# Patient Record
Sex: Female | Born: 1935 | Race: White | Hispanic: No | Smoking: Never smoker
Health system: Southern US, Community
[De-identification: ages and names within clinical notes are randomized; demographics above are authoritative.]

## PROBLEM LIST (undated history)

## (undated) DIAGNOSIS — F32A Depression, unspecified: Secondary | ICD-10-CM

## (undated) DIAGNOSIS — F329 Major depressive disorder, single episode, unspecified: Secondary | ICD-10-CM

## (undated) DIAGNOSIS — F419 Anxiety disorder, unspecified: Secondary | ICD-10-CM

## (undated) DIAGNOSIS — I10 Essential (primary) hypertension: Secondary | ICD-10-CM

## (undated) DIAGNOSIS — E538 Deficiency of other specified B group vitamins: Secondary | ICD-10-CM

## (undated) DIAGNOSIS — D0000 Carcinoma in situ of oral cavity, unspecified site: Secondary | ICD-10-CM

## (undated) DIAGNOSIS — T7840XA Allergy, unspecified, initial encounter: Secondary | ICD-10-CM

## (undated) DIAGNOSIS — E785 Hyperlipidemia, unspecified: Secondary | ICD-10-CM

## (undated) DIAGNOSIS — I447 Left bundle-branch block, unspecified: Secondary | ICD-10-CM

## (undated) DIAGNOSIS — F0150 Vascular dementia without behavioral disturbance: Secondary | ICD-10-CM

## (undated) HISTORY — DX: Major depressive disorder, single episode, unspecified: F32.9

## (undated) HISTORY — DX: Essential (primary) hypertension: I10

## (undated) HISTORY — DX: Carcinoma in situ of oral cavity, unspecified site: D00.00

## (undated) HISTORY — DX: Hyperlipidemia, unspecified: E78.5

## (undated) HISTORY — PX: ABDOMINAL HYSTERECTOMY: SHX81

## (undated) HISTORY — DX: Allergy, unspecified, initial encounter: T78.40XA

## (undated) HISTORY — PX: TONSILLECTOMY: SUR1361

## (undated) HISTORY — DX: Deficiency of other specified B group vitamins: E53.8

## (undated) HISTORY — DX: Depression, unspecified: F32.A

## (undated) HISTORY — DX: Left bundle-branch block, unspecified: I44.7

## (undated) HISTORY — DX: Vascular dementia, unspecified severity, without behavioral disturbance, psychotic disturbance, mood disturbance, and anxiety: F01.50

## (undated) HISTORY — DX: Anxiety disorder, unspecified: F41.9

---

## 2002-07-03 DIAGNOSIS — F0150 Vascular dementia without behavioral disturbance: Secondary | ICD-10-CM | POA: Insufficient documentation

## 2008-12-28 ENCOUNTER — Encounter: Payer: Self-pay | Admitting: Internal Medicine

## 2009-07-03 HISTORY — PX: CATARACT EXTRACTION: SUR2

## 2009-08-03 ENCOUNTER — Encounter: Payer: Self-pay | Admitting: Internal Medicine

## 2009-08-11 ENCOUNTER — Encounter: Payer: Self-pay | Admitting: Internal Medicine

## 2009-08-16 ENCOUNTER — Ambulatory Visit: Payer: Self-pay | Admitting: Internal Medicine

## 2009-08-16 DIAGNOSIS — M810 Age-related osteoporosis without current pathological fracture: Secondary | ICD-10-CM | POA: Insufficient documentation

## 2009-08-16 DIAGNOSIS — E538 Deficiency of other specified B group vitamins: Secondary | ICD-10-CM

## 2009-08-16 DIAGNOSIS — J309 Allergic rhinitis, unspecified: Secondary | ICD-10-CM | POA: Insufficient documentation

## 2009-08-16 DIAGNOSIS — I10 Essential (primary) hypertension: Secondary | ICD-10-CM | POA: Insufficient documentation

## 2009-08-16 DIAGNOSIS — F411 Generalized anxiety disorder: Secondary | ICD-10-CM | POA: Insufficient documentation

## 2009-08-16 DIAGNOSIS — E785 Hyperlipidemia, unspecified: Secondary | ICD-10-CM | POA: Insufficient documentation

## 2009-08-16 DIAGNOSIS — F329 Major depressive disorder, single episode, unspecified: Secondary | ICD-10-CM | POA: Insufficient documentation

## 2009-08-16 DIAGNOSIS — C069 Malignant neoplasm of mouth, unspecified: Secondary | ICD-10-CM | POA: Insufficient documentation

## 2009-08-19 ENCOUNTER — Ambulatory Visit: Payer: Self-pay | Admitting: Internal Medicine

## 2009-08-23 LAB — CONVERTED CEMR LAB
AST: 18 units/L (ref 0–37)
Albumin: 3.4 g/dL — ABNORMAL LOW (ref 3.5–5.2)
Alkaline Phosphatase: 162 units/L — ABNORMAL HIGH (ref 39–117)
BUN: 16 mg/dL (ref 6–23)
Basophils Absolute: 0 10*3/uL (ref 0.0–0.1)
Bilirubin, Direct: 0.1 mg/dL (ref 0.0–0.3)
CO2: 30 meq/L (ref 19–32)
Calcium: 8.8 mg/dL (ref 8.4–10.5)
Chloride: 106 meq/L (ref 96–112)
Cholesterol: 167 mg/dL (ref 0–200)
Eosinophils Absolute: 0.2 10*3/uL (ref 0.0–0.7)
LDL Cholesterol: 89 mg/dL (ref 0–99)
Lymphocytes Relative: 18.4 % (ref 12.0–46.0)
MCHC: 32.6 g/dL (ref 30.0–36.0)
MCV: 92.9 fL (ref 78.0–100.0)
Monocytes Absolute: 0.4 10*3/uL (ref 0.1–1.0)
Neutro Abs: 4.9 10*3/uL (ref 1.4–7.7)
Neutrophils Relative %: 71.8 % (ref 43.0–77.0)
RDW: 13.1 % (ref 11.5–14.6)
TSH: 1.52 microintl units/mL (ref 0.35–5.50)
Total Protein: 6.5 g/dL (ref 6.0–8.3)
Triglycerides: 84 mg/dL (ref 0.0–149.0)

## 2009-08-25 ENCOUNTER — Ambulatory Visit: Payer: Self-pay | Admitting: Internal Medicine

## 2009-08-27 ENCOUNTER — Ambulatory Visit: Payer: Self-pay | Admitting: Internal Medicine

## 2009-08-31 DIAGNOSIS — I447 Left bundle-branch block, unspecified: Secondary | ICD-10-CM

## 2009-08-31 HISTORY — DX: Left bundle-branch block, unspecified: I44.7

## 2009-08-31 HISTORY — PX: OTHER SURGICAL HISTORY: SHX169

## 2009-09-06 ENCOUNTER — Encounter: Payer: Self-pay | Admitting: Internal Medicine

## 2009-09-08 ENCOUNTER — Ambulatory Visit: Payer: Self-pay | Admitting: Internal Medicine

## 2009-09-13 ENCOUNTER — Encounter: Payer: Self-pay | Admitting: Internal Medicine

## 2009-09-13 ENCOUNTER — Telehealth: Payer: Self-pay | Admitting: Internal Medicine

## 2009-09-15 ENCOUNTER — Encounter: Payer: Self-pay | Admitting: Internal Medicine

## 2009-09-20 ENCOUNTER — Encounter: Payer: Self-pay | Admitting: Internal Medicine

## 2009-09-21 ENCOUNTER — Encounter: Payer: Self-pay | Admitting: Internal Medicine

## 2009-09-22 ENCOUNTER — Encounter: Payer: Self-pay | Admitting: Internal Medicine

## 2009-09-27 ENCOUNTER — Encounter: Payer: Self-pay | Admitting: Internal Medicine

## 2009-10-12 ENCOUNTER — Telehealth (INDEPENDENT_AMBULATORY_CARE_PROVIDER_SITE_OTHER): Payer: Self-pay | Admitting: *Deleted

## 2009-10-14 ENCOUNTER — Encounter: Payer: Self-pay | Admitting: Internal Medicine

## 2009-10-15 ENCOUNTER — Ambulatory Visit: Payer: Self-pay | Admitting: Internal Medicine

## 2009-10-18 ENCOUNTER — Encounter: Payer: Self-pay | Admitting: Internal Medicine

## 2009-10-25 ENCOUNTER — Encounter: Payer: Self-pay | Admitting: Internal Medicine

## 2009-10-26 ENCOUNTER — Encounter: Payer: Self-pay | Admitting: Internal Medicine

## 2009-11-01 ENCOUNTER — Encounter: Payer: Self-pay | Admitting: Internal Medicine

## 2009-11-04 ENCOUNTER — Encounter: Payer: Self-pay | Admitting: Family Medicine

## 2009-11-08 ENCOUNTER — Ambulatory Visit: Payer: Self-pay | Admitting: Internal Medicine

## 2009-11-08 ENCOUNTER — Encounter: Payer: Self-pay | Admitting: Internal Medicine

## 2009-11-15 ENCOUNTER — Encounter: Payer: Self-pay | Admitting: Internal Medicine

## 2009-11-18 ENCOUNTER — Encounter: Payer: Self-pay | Admitting: Internal Medicine

## 2009-12-06 ENCOUNTER — Encounter: Payer: Self-pay | Admitting: Internal Medicine

## 2010-01-04 ENCOUNTER — Ambulatory Visit: Payer: Self-pay | Admitting: Ophthalmology

## 2010-01-10 ENCOUNTER — Ambulatory Visit: Payer: Self-pay | Admitting: Internal Medicine

## 2010-02-14 ENCOUNTER — Telehealth: Payer: Self-pay | Admitting: Internal Medicine

## 2010-02-15 ENCOUNTER — Encounter: Payer: Self-pay | Admitting: Internal Medicine

## 2010-02-21 ENCOUNTER — Encounter: Payer: Self-pay | Admitting: Internal Medicine

## 2010-02-22 ENCOUNTER — Ambulatory Visit: Payer: Self-pay | Admitting: Ophthalmology

## 2010-03-03 ENCOUNTER — Encounter: Payer: Self-pay | Admitting: Internal Medicine

## 2010-03-03 ENCOUNTER — Ambulatory Visit: Payer: Self-pay | Admitting: Internal Medicine

## 2010-05-16 ENCOUNTER — Ambulatory Visit: Payer: Self-pay | Admitting: Internal Medicine

## 2010-05-18 ENCOUNTER — Encounter: Payer: Self-pay | Admitting: Internal Medicine

## 2010-06-20 ENCOUNTER — Encounter: Payer: Self-pay | Admitting: Internal Medicine

## 2010-07-14 ENCOUNTER — Encounter: Payer: Self-pay | Admitting: Internal Medicine

## 2010-07-14 DIAGNOSIS — D510 Vitamin B12 deficiency anemia due to intrinsic factor deficiency: Secondary | ICD-10-CM

## 2010-07-14 DIAGNOSIS — F329 Major depressive disorder, single episode, unspecified: Secondary | ICD-10-CM

## 2010-07-14 DIAGNOSIS — F0391 Unspecified dementia with behavioral disturbance: Secondary | ICD-10-CM

## 2010-07-14 DIAGNOSIS — I10 Essential (primary) hypertension: Secondary | ICD-10-CM

## 2010-08-02 NOTE — Miscellaneous (Signed)
Summary: Elizabeth Cline FACE SHEET  Elizabeth Cline FACE SHEET   Imported By: Carin Primrose 08/17/2009 08:23:15  _____________________________________________________________________  External Attachment:    Type:   Image     Comment:   External Document

## 2010-08-02 NOTE — Miscellaneous (Signed)
Summary: Ativan Order/[facility]  [facility]ivan Order/[facility]   Imported By: Lanelle Bal 11/10/2009 10:38:12  _____________________________________________________________________  External Attachment:    Type:   Image     Comment:   External Document

## 2010-08-02 NOTE — Letter (Signed)
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

## 2010-08-02 NOTE — Miscellaneous (Signed)
Summary: Claritin Order/[facility]  Claritin Order/[facility]   Imported By: Lanelle Bal 10/21/2009 07:51:16  _____________________________________________________________________  External Attachment:    Type:   Image     Comment:   External Document

## 2010-08-02 NOTE — Miscellaneous (Signed)
Summary: Resumption of Care Orders/[facility]  Resumption of Care Orders/[facility]   Imported By: Lanelle Bal 09/16/2009 13:29:38  _____________________________________________________________________  External Attachment:    Type:   Image     Comment:   External Document

## 2010-08-02 NOTE — Letter (Signed)
Summary: [facility]  [facility]   Imported By: Lanelle Bal 09/13/2009 11:27:24  _____________________________________________________________________  External Attachment:    Type:   Image     Comment:   External Document  Appended Document: [facility] ENT planning right inferior maxillectomy if cancer is localized

## 2010-08-02 NOTE — Miscellaneous (Signed)
Summary: Episode Summary/[facility]  Episode Summary/[facility]   Imported By: Lanelle Bal 11/03/2009 11:46:28  _____________________________________________________________________  External Attachment:    Type:   Image     Comment:   External Document

## 2010-08-02 NOTE — Miscellaneous (Signed)
Summary: Physician's Orders/[facility]  Physician's Orders/[facility]   Imported By: Maryln Gottron 02/18/2010 12:30:10  _____________________________________________________________________  External Attachment:    Type:   Image     Comment:   External Document

## 2010-08-02 NOTE — Letter (Signed)
Summary: Generic Letter  [facility]  [address] [place], [place] [address]   Phone: [phone]  Fax: [phone]        10/15/2009  [facility] Court Disability Division ATTN: Stefano Gaul [address] [place], [place] [address]  RE: Elizabeth Cline     DOB  [birth year]  Dear Simonne Come,  I am the internist who has assumed primary care for Elizabeth Cline since her move to [facility]. She came to be evaluated for surgery for an oral cancer of unknown severity at [facility] and to help her daughter coordinate that care. She has since had the surgery to remove the cancer which fortunately was limited.  [name] suffers from dementia, probably vascular in nature. This is mild in severity as she can still converse and handle many of her activities of daily living. However, she is unable to live without professional supervision and is on a secure dementia unit in a local assisted living facility. She had some initial anxiety which is common with any change, but has adjusted well and has settled in.  [name]'s insight and judgement are significantly impaired. She has had difficulty in participating in decision making for some time and exhibits anxiety when prompted to make decisions.  I believe it would cause her considerable anxiety and distress if she was required to return to [place] to testify in court. Even an overnight stay in another setting may reverse all the acclimation she has finally achieved to her assisted living home. She has significant emotional lability and the stress of a court appearance would likely have detrimental effects on her mood, as well as having limited credibility in any decisions that need to be made in her case.  I would be happy to furnish further information should that be necessary.   Sincerely,     Tillman Abide MD

## 2010-08-02 NOTE — Miscellaneous (Signed)
Summary: Authorization and Care Plan/[facility]  Authorization and Care Plan/[facility]   Imported By: Maryln Gottron 09/28/2009 09:50:07  _____________________________________________________________________  External Attachment:    Type:   Image     Comment:   External Document

## 2010-08-02 NOTE — Miscellaneous (Signed)
Summary: TB Screening Form/[facility]  TB Screening Form/[facility]   Imported By: Lanelle Bal 08/31/2009 08:46:33  _____________________________________________________________________  External Attachment:    Type:   Image     Comment:   External Document

## 2010-08-02 NOTE — Miscellaneous (Signed)
Summary: Claritin Order/[facility]  Claritin Order[facility]   Imported By: Lanelle Bal 11/25/2009 08:50:37  _____________________________________________________________________  External Attachment:    Type:   Image     Comment:   External Document

## 2010-08-02 NOTE — Miscellaneous (Signed)
Summary: Care Plan/[facility]  Care Plan/[facility]   Imported By: Lanelle Bal 03/11/2010 13:55:23  _____________________________________________________________________  External Attachment:    Type:   Image     Comment:   External Document

## 2010-08-02 NOTE — Miscellaneous (Signed)
Summary: Med Orders/[facility]  Med Orders/[facility]   Imported By: Lanelle Bal 09/16/2009 13:28:30  _____________________________________________________________________  External Attachment:    Type:   Image     Comment:   External Document

## 2010-08-02 NOTE — Miscellaneous (Signed)
Summary: Physician Order/[facility]  Physician Order/[facility]   Imported By: Maryln Gottron 09/20/2009 12:30:45  _____________________________________________________________________  External Attachment:    Type:   Image     Comment:   External Document

## 2010-08-02 NOTE — Assessment & Plan Note (Signed)
Summary: 2:00 NEW PT TO EST/CLE   Vital Signs:  Patient profile:   77 year old female Height:      63 inches Weight:      116 pounds Temp:     97.9 degrees F oral Pulse rate:   64 / minute Pulse rhythm:   regular BP sitting:   130 / 78  (right arm)  Vitals Entered By: Linde Gillis CMA Duncan Dull) (August 16, 2009 1:51 PM) CC: new patient, est care   History of Present Illness: here with daughter Sabino Snipes here from [facility] She is very upset---"I won't be able to go to church..."  Moved to 2nd floor at [facility] about 10 days ago  [place] deal of change Living in her own home until October Memory Care program there  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 Here for support needed for the surgery Seen by Dr Sharlene Dory at [facility] referred by Dr [facility] Surgery scheuduled March 10th  Cognitive decline started about 7 years ago but accelerated in past year Diagnosed with vascular dementia  Mood issues lexapro started about 6 weeks ago lorazepam as needed also Rx'd then--not carried over here   Preventive Screening-Counseling & Management  Alcohol-Tobacco     Smoking Status: never  Allergies (verified): No Known Drug Allergies  Past History:  Past Medical History: Vascular dementia Hyperlipidemia Hypertension Osteoporosis Vitamin B12 deficiency Allergic rhinitis Anxiety Depression  Past Surgical History: Hysterectomy Tonsillectomy  Family History: Mom died of CVA @62  Dad died @76  heart disease Brother died @93  of CHF sister alive with [place] DM in dad No cancer  Social History: Widow-- 47. 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 Will be transferring to [facility].Smoking Status:  never  Review of Systems General:  Denies sleep disorder; appetite okay weight down  ~10-153 over the past few years. Eyes:   Denies double vision and vision loss-1 eye. ENT:  Denies decreased hearing and ringing in ears; partial on bottom 2 extractions on each side --upper molars. CV:  Denies chest pain or discomfort, difficulty breathing at night, difficulty breathing while lying down, fainting, lightheadness, palpitations, and shortness of breath with exertion. Resp:  Denies cough and shortness of breath. GI:  Denies abdominal pain, bloody stools, change in bowel habits, dark tarry stools, indigestion, and nausea. GU:  Denies dysuria and incontinence. MS:  Denies joint pain and joint swelling. Derm:  Denies lesion(s) and rash. Neuro:  Denies headaches and weakness. Psych:  Complains of depression, easily angered, and irritability; anger when she is depressed some long standing but milder anxiety generally has done okay with the lexapro. Heme:  Denies abnormal bruising and enlarge lymph nodes. Allergy:  Complains of seasonal allergies and sneezing; no meds Gets some nasal drip.  Physical Exam  General:  alert and normal appearance.   Eyes:  pupils equal, pupils round, and pupils reactive to light.   Mouth:  no erythema and no lesions.   Neck:  supple, no masses, no thyromegaly, no carotid bruits, and no cervical lymphadenopathy.   Lungs:  normal respiratory effort and normal breath sounds.   Heart:  normal rate, regular rhythm, no murmur, and no gallop.   Abdomen:  soft and non-tender.   Msk:  no joint tenderness and no joint swelling.   Extremities:  no edema Neurologic:  alert . Not oriented to time at all, [employer]  office  Appropriate conversation though recall problems (like FH details) [id number] Recall 2/3 after about 2 minutes Skin:  no rashes and no suspicious lesions.   Psych:  normally interactive and good eye contact.   Did have some brief anger---displeased about the move here but then settled right down again   Impression &  Recommendations:  Problem # 1:  VASCULAR DEMENTIA (ICD-290.40) Assessment Comment Only had been noncompliant with meds until October Not clear that she is better cognitively since being on it regularly will stop the aricept and see how she does Needs to be on ASA  Problem # 2:  HYPERTENSION (ICD-401.9) Assessment: Unchanged  good control no changes needed  Her updated medication list for this problem includes:    Metoprolol Tartrate 25 Mg Tabs (Metoprolol tartrate) .Marland Kitchen... Take one tablet twice daily  BP today: 130/78  Orders: Venipuncture (11914) TLB-Renal Function Panel (80069-RENAL) TLB-CBC Platelet - w/Differential (85025-CBCD) TLB-Hepatic/Liver Function Pnl (80076-HEPATIC) TLB-TSH (Thyroid Stimulating Hormone) (84443-TSH) TLB-T4 (Thyrox), Free [phone])  Problem # 3:  DEPRESSION (ICD-311) Assessment: Comment Only seems to be okay in general on the lexapro  Her updated medication list for this problem includes:    Lexapro 10 Mg Tabs (Escitalopram oxalate) .Marland Kitchen... Take one tablet daily    Lorazepam 0.5 Mg Tabs (Lorazepam) .Marland Kitchen... 1 tab by mouth three times a day as needed for anxiety  Problem # 4:  ANXIETY (ICD-300.00) Assessment: Comment Only will add back as needed lorazepam if stabliizes, will take off the seroquel  Her updated medication list for this problem includes:    Lexapro 10 Mg Tabs (Escitalopram oxalate) .Marland Kitchen... Take one tablet daily    Lorazepam 0.5 Mg Tabs (Lorazepam) .Marland Kitchen... 1 tab by mouth three times a day as needed for anxiety  Problem # 5:  HYPERLIPIDEMIA (ICD-272.4) Assessment: Comment Only  despite age, may be worth retrying statin given her vascular dementia will reconsider after mouth surgery  Orders: TLB-Lipid Panel (80061-LIPID)  Problem # 6:  CARCINOMA, SQUAMOUS CELL, MOUTH (ICD-145.9) Assessment: Comment Only due for surgery next month  Complete Medication List: 1)  Calcitrate 200mg   .... Take one tablet twice daily 2)  Vitamin B-12  1000 Mcg Tabs (Cyanocobalamin) .... Once a month 3)  Metoprolol Tartrate 25 Mg Tabs (Metoprolol tartrate) .... Take one tablet twice daily 4)  Lexapro 10 Mg Tabs (Escitalopram oxalate) .... Take one tablet daily 5)  Seroquel 25 Mg Tabs (Quetiapine fumarate) .... Take 1/2 tablet at 4pm daily 6)  Lorazepam 0.5 Mg Tabs (Lorazepam) .Marland Kitchen.. 1 tab by mouth three times a day as needed for anxiety 7)  Vitamin D (ergocalciferol) 50000 Unit Caps (Ergocalciferol) .Marland Kitchen.. 1 tab by mouth every 2 weeks 8)  Aspirin Ec 81 Mg Tbec (Aspirin) .Marland Kitchen.. 1 by mouth daily  Patient Instructions: 1)  Will plan follow up at [facility] in the next few months  Current Allergies (reviewed today): No known allergies    Immunization History:  Pneumovax Immunization History:    Pneumovax:  Historical (07/03/1998)

## 2010-08-02 NOTE — Progress Notes (Signed)
Summary: daughter requests letter, phone call  Phone Note Call from Patient Call back at Home Phone [phone]   Caller: Daughter Bosie Clos   Summary of Call: Pt's daughter is asking that you write a letter stating that it would not be in pt's best interest to appear in court in [place] in may.  Pt's husband is taking the daughter to court to have her legal gaurdianship revoked on the grounds that she has overstepped her bounds by bringing the pt here for her cancer surgery and follow up.  The pt is to testify and daughter feels that the long trip to [place] and back would not be good for the pt.  Besides that, the pt has dementia and the daughter wonders how much her testimony would be worth anyway.  Daughter is asking that you call her or [facility] to discuss. Initial call taken by: Lowella Petties CMA,  October 12, 2009 9:09 AM  Follow-up for Phone Call        message left will try to get her back tomorrow Cindee Salt MD  October 12, 2009 1:22 PM   discussed situation she will fax address and court that needs the letter discussed the charge will do in the next few days Follow-up by: Cindee Salt MD,  October 13, 2009 1:26 PM  Additional Follow-up for Phone Call Additional follow up Details #1::        Letter done Please call daughter for pick up $20 Additional Follow-up by: Cindee Salt MD,  October 15, 2009 2:15 PM    Additional Follow-up for Phone Call Additional follow up Details #2::    I left a message on pt's daughter's work voice mail that form was ready to be picked up. Follow-up by: Beau Fanny,  October 15, 2009 2:38 PM

## 2010-08-02 NOTE — Letter (Signed)
Summary: Patient History/[facility]  Patient History/[facility]   Imported By: Lanelle Bal 08/18/2009 13:15:22  _____________________________________________________________________  External Attachment:    Type:   Image     Comment:   External Document

## 2010-08-02 NOTE — Miscellaneous (Signed)
Summary: SN Order/[facility]  SN Order/[facility]   Imported By: Lanelle Bal 10/28/2009 13:51:29  _____________________________________________________________________  External Attachment:    Type:   Image     Comment:   External Document

## 2010-08-02 NOTE — Miscellaneous (Signed)
Summary: ST Orders/[facility]  ST Orders/[facility]   Imported By: Lanelle Bal 11/11/2009 10:07:12  _____________________________________________________________________  External Attachment:    Type:   Image     Comment:   External Document

## 2010-08-02 NOTE — Miscellaneous (Signed)
Summary: Care Plan/[facility]  Care Plan/[facility]   Imported By: Lanelle Bal 05/24/2010 15:58:58  _____________________________________________________________________  External Attachment:    Type:   Image     Comment:   External Document

## 2010-08-02 NOTE — Miscellaneous (Signed)
Summary: Physician's Orders/[facility]  Physician's Orders/[facility]   Imported By: Maryln Gottron 05/27/2010 09:31:11  _____________________________________________________________________  External Attachment:    Type:   Image     Comment:   External Document

## 2010-08-02 NOTE — Progress Notes (Signed)
Summary: questions regarding meds  Phone Note Call from Patient   Caller: Daughter Kelby Aline  [phone] Summary of Call: Pt's daughter states pt has been doing well since her cancer surgery and she is asking if pt still needs to continue with B12 injections and seroquil.  Please advise. Initial call taken by: Lowella Petties CMA,  February 14, 2010 4:12 PM  Follow-up for Phone Call        she should continue the vitamin B12  Let daughter know I am tentatively planning visit to [facility] on September 1st. We can meet then and discuss the seroquel  Follow-up by: Cindee Salt MD,  February 15, 2010 8:05 AM  Additional Follow-up for Phone Call Additional follow up Details #1::        [facility] for daughter to call back.                Lowella Petties CMA  February 15, 2010 8:33 AM   Okay---I will confirm the appt at [facility] next week with Bonita Quin there and she can let her know a time---it will be in the late morning Cindee Salt MD  February 15, 2010 8:46 AM     Additional Follow-up for Phone Call Additional follow up Details #2::    Advised pt's daughter.  Pt is going to have a cataract removed on tues, 8/23- that morning and will go back for post op check on wednesday 8/24.  Then she has another post op on 8/31.   Lowella Petties CMA  February 15, 2010 9:58 AM

## 2010-08-02 NOTE — Miscellaneous (Signed)
Summary: Care Plan/[facility]  Care Plan/[facility]   Imported By: Lanelle Bal 09/13/2009 10:56:21  _____________________________________________________________________  External Attachment:    Type:   Image     Comment:   External Document

## 2010-08-02 NOTE — Miscellaneous (Signed)
Summary: DNR Order  DNR Order   Imported By: Lanelle Bal 08/18/2009 13:13:26  _____________________________________________________________________  External Attachment:    Type:   Image     Comment:   External Document

## 2010-08-02 NOTE — Letter (Signed)
Summary: Fax from Patient Daughter Regarding Court Proceedings in [place]  Fax from Patient Daughter Regarding Court Proceedings in [place]   Imported By: Lanelle Bal 10/20/2009 11:18:26  _____________________________________________________________________  External Attachment:    Type:   Image     Comment:   External Document

## 2010-08-02 NOTE — Miscellaneous (Signed)
Summary: Med & Standing Orders/[facility]  Med & Standing Orders/[facility]   Imported By: Lanelle Bal 02/28/2010 09:25:37  _____________________________________________________________________  External Attachment:    Type:   Image     Comment:   External Document

## 2010-08-02 NOTE — Miscellaneous (Signed)
Summary: D/C Roxicet/[facility]  D/C Roxicet/[facility]   Imported By: Lanelle Bal 10/02/2009 08:42:52  _____________________________________________________________________  External Attachment:    Type:   Image     Comment:   External Document

## 2010-08-02 NOTE — Assessment & Plan Note (Signed)
Summary: [facility]   Vital Signs:  Patient profile:   77 year old female Weight:      110 pounds BMI:     19.56 Temp:     97.5 degrees F Pulse rate:   68 / minute Resp:     18 per minute BP sitting:   140 / 80  History of Present Illness: Seen with Bonita Quin the clinical coordinator Daughter unable to be here for visit  Just had cataract surgery so on multiple drops post op seems to have done well with this  Had oral surgery in March Outcome was good and apparently cancer was localized  has done well here Had adjusted well No longer on lorazepam Mood has been good--no anxiety or agitation 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  Past Medical History: Vascular dementia Hyperlipidemia Hypertension Osteoporosis Vitamin B12 deficiency Allergic rhinitis Anxiety Depression Intermittent LBBB during oral surgery 08/2009 Squamous cell cancer of mouth  Past Surgical History: Hysterectomy Tonsillectomy 3/11 Oral cancer removed Cataracts done 2011  Family History: Reviewed history from 08/16/2009 and no changes required. Mom died of CVA @62  Dad died @76  heart disease Brother died @93  of CHF sister alive with [place] DM in dad No cancer  Social History: Reviewed history from 08/16/2009 and no changes required. Widow-- 51. 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 Will be transferring to [facility].  Review of Systems       Appetite is good weight down from inital visit---stable here for  ~4 months now Sleeps well No arthritic problems  Physical Exam  General:  alert and normal appearance.   Neck:  supple, no masses, no thyromegaly, no carotid bruits, and no cervical lymphadenopathy.   Lungs:  normal respiratory effort, no intercostal retractions, no accessory muscle use, and normal breath sounds.   Heart:  normal rate, regular rhythm, no murmur, and no gallop.   Abdomen:  soft and non-tender.   Msk:  no joint tenderness and no joint swelling.   Extremities:  no edema Neurologic:  strength normal in all extremities and gait normal--though slow Psych:  normally interactive, good eye contact, not anxious appearing, and not depressed appearing.  Loquacious--telling story about childhood and grandparents   Impression & Recommendations:  Problem # 1:  VASCULAR DEMENTIA (ICD-290.40) Assessment Unchanged doing well stable cognitive and functional status  Problem # 2:  DEPRESSION (ICD-311) Assessment: Improved doing well Has really adjusted well and enjoys being here now  The following medications were removed from the medication list:    Lorazepam 0.5 Mg Tabs (Lorazepam) .Marland Kitchen... 1 tab by mouth three times a day as needed for anxiety Her updated medication list for this problem includes:    Lexapro 10 Mg Tabs (Escitalopram oxalate) .Marland Kitchen... Take one tablet daily  Problem # 3:  ANXIETY (ICD-300.00) Assessment: Improved off lorazepam will stop the low dose of seroquel as she doesn't appear to need that now  The following medications were removed from the medication list:  Lorazepam 0.5 Mg Tabs (Lorazepam) .Marland Kitchen... 1 tab by mouth three times a day as needed for anxiety Her updated medication list for this problem includes:    Lexapro 10 Mg Tabs (Escitalopram oxalate) .Marland Kitchen... Take one tablet daily  Problem # 4:  HYPERTENSION (ICD-401.9) Assessment: Unchanged good  control no changes needed  Her updated medication list for this problem includes:    Metoprolol Tartrate 25 Mg Tabs (Metoprolol tartrate) .Marland Kitchen... Take one tablet twice daily  BP today: 140/80 Prior BP: 130/78 (08/16/2009)  Labs Reviewed: K+: 4.3 (08/19/2009) Creat: : 1.2 (08/19/2009)   Chol: 167 (08/19/2009)   HDL: 61.00 (08/19/2009)   LDL: 89 (08/19/2009)   TG: 84.0 (08/19/2009)  Problem # 5:  CARCINOMA, SQUAMOUS CELL, MOUTH (ICD-145.9) Assessment: Comment Only excised  Follow up will be with the surgeon on their schedule  Problem # 6:  OSTEOPOROSIS (ICD-733.00) Assessment: Comment Only on calcium and vitamin D  Her updated medication list for this problem includes:    Vitamin D (ergocalciferol) 50000 Unit Caps (Ergocalciferol) .Marland Kitchen... 1 tab by mouth every 2 weeks    Calcium Citrate 200 Mg Tabs (Calcium citrate) .Marland Kitchen... 1 tab by mouth two times a day  Complete Medication List: 1)  Vitamin B-12 1000 Mcg Tabs (Cyanocobalamin) .... Once a month 2)  Metoprolol Tartrate 25 Mg Tabs (Metoprolol tartrate) .... Take one tablet twice daily 3)  Lexapro 10 Mg Tabs (Escitalopram oxalate) .... Take one tablet daily 4)  Vitamin D (ergocalciferol) 50000 Unit Caps (Ergocalciferol) .Marland Kitchen.. 1 tab by mouth every 2 weeks 5)  Aspirin Ec 81 Mg Tbec (Aspirin) .Marland Kitchen.. 1 by mouth daily 6)  Calcium Citrate 200 Mg Tabs (Calcium citrate) .Marland Kitchen.. 1 tab by mouth two times a day 7)  Ocuvite Preservision Tabs (Multiple vitamins-minerals) .... 2 tabs by mouth two times a day  Patient Instructions: 1)  Will plan follow up visit in about 4 months

## 2010-08-02 NOTE — Miscellaneous (Signed)
Summary: [facility]  [facility]   Imported By: Lanelle Bal 11/11/2009 10:04:00  _____________________________________________________________________  External Attachment:    Type:   Image     Comment:   External Document

## 2010-08-02 NOTE — Letter (Signed)
Summary: [facility]  [facility]   Imported By: Lanelle Bal 08/18/2009 13:11:11  _____________________________________________________________________  External Attachment:    Type:   Image     Comment:   External Document  Appended Document: [facility] E[facility] planning surgery for oral squamous cell ca has nasal mass that will be biopsied first (frozen section) and procedure then only if negative

## 2010-08-02 NOTE — Miscellaneous (Signed)
Summary: [facility] Order/[facility]  [facility] Order/[facility]   Imported By: Lanelle Bal 11/19/2009 13:48:04  _____________________________________________________________________  External Attachment:    Type:   Image     Comment:   External Document

## 2010-08-02 NOTE — Miscellaneous (Signed)
Summary: PT Eval Order/[facility]  PT Eval Order/[facility]   Imported By: Lanelle Bal 09/23/2009 13:04:29  _____________________________________________________________________  External Attachment:    Type:   Image     Comment:   External Document

## 2010-08-02 NOTE — Assessment & Plan Note (Signed)
Summary: TB TEST/Elizabeth Cline/[facility]  Nurse Visit   Allergies: No Known Drug Allergies  Immunizations Administered:  PPD Skin Test:    Vaccine Type: PPD    Site: left forearm    Mfr: Sanofi Pasteur    Dose: 0.1 ml    Route: ID    Given by: Mervin Hack CMA (AAMA)    Exp. Date: 11/28/2011    Lot #: [id number]  Orders Added: 1)  TB Skin Test [86580] 2)  Admin 1st Vaccine [phone]

## 2010-08-02 NOTE — Miscellaneous (Signed)
Summary: Disability Judgment & Guardianship Papers  Disability Judgment & Guardianship Papers   Imported By: Lanelle Bal 08/18/2009 13:12:32  _____________________________________________________________________  External Attachment:    Type:   Image     Comment:   External Document

## 2010-08-02 NOTE — Miscellaneous (Signed)
Summary: [facility]  [facility]   Imported By: Lanelle Bal 01/21/2010 12:27:15  _____________________________________________________________________  External Attachment:    Type:   Image     Comment:   External Document

## 2010-08-02 NOTE — Assessment & Plan Note (Signed)
Summary: tb skin test read/ [facility]  Nurse Visit   Allergies: No Known Drug Allergies  PPD Results    Date of reading: 08/27/2009    Results: < 5mm    Interpretation: negative

## 2010-08-02 NOTE — Letter (Signed)
Summary: [facility] ENT  [facility]   Imported By: Lanelle Bal 09/30/2009 12:08:26  _____________________________________________________________________  External Attachment:    Type:   Image     Comment:   External Document  Appended Document: [facility] ENT doing okay after limited maxillary antrectomy for small tumor Obturator being replaced wants ensure supplements

## 2010-08-02 NOTE — Progress Notes (Signed)
Summary: regarding oral surgery  Phone Note Call from Patient   Caller: Daughter Summary of Call: Pt had surgery for oral cancer last week.  That went well, but there are a couple of things that her daughter wants you to know.  One is that the anesthesiologist reported that a left bundle branch block showed up 2-3 times during surgery, but this did correct itself.  Also, on her pre op and surgery days her heart rate was low, 50-60 but her BP was good.  Pt is now back at [facility] [employer] and will be going back to [facility] for post op visit on 3/21.  She has been given orders for PT, OT, and speech therapy. Initial call taken by: Lowella Petties CMA,  September 13, 2009 11:07 AM  Follow-up for Phone Call        The heart rate seems about right for her, considering the rate when I saw her I made a note of the intermittent left bundle branch block but no reason to take any action about this now---unless she has symptoms like chest pain or fainting spells Follow-up by: Cindee Salt MD,  September 13, 2009 1:34 PM  Additional Follow-up for Phone Call Additional follow up Details #1::        left message on machine for patient's daughter Bosie Clos BoBo  to return call.  DeShannon Smith CMA Duncan Dull)  September 13, 2009 3:25 PM  Advised pt's daughter.  Additional Follow-up by: Lowella Petties CMA,  September 14, 2009 9:55 AM      Past History:  Past Medical History: Vascular dementia Hyperlipidemia Hypertension Osteoporosis Vitamin B12 deficiency Allergic rhinitis Anxiety Depression Intermittent LBBB during oral surgery 08/2009  Past Surgical History: Hysterectomy Tonsillectomy 3/11 Oral cancer removed

## 2010-08-04 NOTE — Miscellaneous (Signed)
Summary: [facility] report  [facility] report   Imported By: Lester Sells 07/05/2010 10:50:50  _____________________________________________________________________  External Attachment:    Type:   Image     Comment:   External Document

## 2010-08-04 NOTE — Miscellaneous (Signed)
Summary: [facility]  [facility]   Imported By: Lanelle Bal 07/21/2010 08:38:41  _____________________________________________________________________  External Attachment:    Type:   Image     Comment:   External Document

## 2010-09-15 DIAGNOSIS — D510 Vitamin B12 deficiency anemia due to intrinsic factor deficiency: Secondary | ICD-10-CM

## 2010-09-15 DIAGNOSIS — I10 Essential (primary) hypertension: Secondary | ICD-10-CM

## 2010-09-15 DIAGNOSIS — F0391 Unspecified dementia with behavioral disturbance: Secondary | ICD-10-CM

## 2010-09-15 DIAGNOSIS — F329 Major depressive disorder, single episode, unspecified: Secondary | ICD-10-CM

## 2010-09-18 ENCOUNTER — Encounter: Payer: Self-pay | Admitting: Internal Medicine

## 2010-09-30 ENCOUNTER — Other Ambulatory Visit: Payer: Self-pay | Admitting: *Deleted

## 2010-09-30 MED ORDER — ASPIRIN 81 MG PO TABS: 81.0000 mg | ORAL_TABLET | Freq: Every day | ORAL | Status: DC

## 2010-09-30 NOTE — Telephone Encounter (Signed)
Called to [employer].

## 2010-10-05 ENCOUNTER — Encounter: Payer: Self-pay | Admitting: Internal Medicine

## 2010-10-05 ENCOUNTER — Non-Acute Institutional Stay: Payer: Medicare PPO | Admitting: Internal Medicine

## 2010-10-05 VITALS — BP 140/80 | HR 62 | Temp 97.0°F | Resp 18 | Wt 113.0 lb

## 2010-10-05 DIAGNOSIS — F411 Generalized anxiety disorder: Secondary | ICD-10-CM

## 2010-10-05 DIAGNOSIS — I10 Essential (primary) hypertension: Secondary | ICD-10-CM

## 2010-10-05 DIAGNOSIS — F3289 Other specified depressive episodes: Secondary | ICD-10-CM

## 2010-10-05 DIAGNOSIS — F0150 Vascular dementia without behavioral disturbance: Secondary | ICD-10-CM

## 2010-10-05 DIAGNOSIS — F329 Major depressive disorder, single episode, unspecified: Secondary | ICD-10-CM

## 2010-10-05 NOTE — Patient Instructions (Signed)
Will plan follow up in 4-6 months 

## 2010-10-05 NOTE — Progress Notes (Signed)
  Subjective:    Patient ID: Elizabeth Cline, female    DOB: 1915-06-25, 77 y.o.   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 Hyperlipidemia   . Hypertension   . Vascular dementia, uncomplicated   . Osteoporosis   . Vitamin B12 deficiency   . Allergy   . Anxiety   . Depression   . LBBB (left bundle branch block) 3/11    transient during oral surgery  . 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 Abdominal hysterectomy   . Tonsillectomy   . Removal of oral cancer 3/11  . Cataract extraction 2011    Family History  Problem Relation Age of Onset  . Heart disease Father   . Diabetes Father   . Dementia Sister   . Heart disease Brother   . Cancer Neg Hx     History   Social History  . Marital Status: Widowed    Spouse Name: N/A    Number of Children: 2  . Years of Education: N/A   Occupational History  . Librarian in elementary school     retired   Social History Main Topics  . Smoking status: Never Smoker   . Smokeless tobacco: Not on file  . Alcohol Use: No     rare in past  . Drug Use: Not on file  . Sexually Active: Not on file   Other Topics Concern  . Not on file   Social History Narrative   Daughter Kelby Aline had guardianship in [place] and was pursuing for [employer]     Review of Systems No recent allergy problems Mouth has healed---had follow up and no signs of recurrence Appetite is good Weight is stable    Objective:   Physical Exam    Constitutional: She appears well-developed and well-nourished. No distress.  Neck: Normal range of motion. No thyromegaly present.  Cardiovascular: Normal heart sounds.   No murmur heard. Pulmonary/Chest: Effort normal. No respiratory distress. She has no rales.  Abdominal: Soft. There is no tenderness.  Musculoskeletal: Normal range of motion. She exhibits no edema and no tenderness.  Lymphadenopathy:    She has no cervical adenopathy.  Neurological: She is alert.       Appropriate conversation  Psychiatric: She has a normal mood and affect. Her behavior is normal. Judgment and thought content normal.          Assessment & Plan:

## 2010-10-24 DIAGNOSIS — D510 Vitamin B12 deficiency anemia due to intrinsic factor deficiency: Secondary | ICD-10-CM

## 2010-10-24 DIAGNOSIS — I10 Essential (primary) hypertension: Secondary | ICD-10-CM

## 2010-10-24 DIAGNOSIS — F0391 Unspecified dementia with behavioral disturbance: Secondary | ICD-10-CM

## 2010-10-24 DIAGNOSIS — F329 Major depressive disorder, single episode, unspecified: Secondary | ICD-10-CM

## 2010-12-29 DIAGNOSIS — F329 Major depressive disorder, single episode, unspecified: Secondary | ICD-10-CM

## 2010-12-29 DIAGNOSIS — F0391 Unspecified dementia with behavioral disturbance: Secondary | ICD-10-CM

## 2010-12-29 DIAGNOSIS — D510 Vitamin B12 deficiency anemia due to intrinsic factor deficiency: Secondary | ICD-10-CM

## 2010-12-29 DIAGNOSIS — I10 Essential (primary) hypertension: Secondary | ICD-10-CM

## 2011-02-28 DIAGNOSIS — I10 Essential (primary) hypertension: Secondary | ICD-10-CM

## 2011-02-28 DIAGNOSIS — F0391 Unspecified dementia with behavioral disturbance: Secondary | ICD-10-CM

## 2011-02-28 DIAGNOSIS — D510 Vitamin B12 deficiency anemia due to intrinsic factor deficiency: Secondary | ICD-10-CM

## 2011-02-28 DIAGNOSIS — F329 Major depressive disorder, single episode, unspecified: Secondary | ICD-10-CM

## 2011-03-02 ENCOUNTER — Non-Acute Institutional Stay: Payer: Medicare PPO | Admitting: Internal Medicine

## 2011-03-02 ENCOUNTER — Encounter: Payer: Self-pay | Admitting: Internal Medicine

## 2011-03-02 VITALS — BP 150/78 | HR 64 | Resp 16 | Wt 117.0 lb

## 2011-03-02 DIAGNOSIS — F411 Generalized anxiety disorder: Secondary | ICD-10-CM

## 2011-03-02 DIAGNOSIS — I10 Essential (primary) hypertension: Secondary | ICD-10-CM

## 2011-03-02 DIAGNOSIS — C069 Malignant neoplasm of mouth, unspecified: Secondary | ICD-10-CM

## 2011-03-02 DIAGNOSIS — F0150 Vascular dementia without behavioral disturbance: Secondary | ICD-10-CM

## 2011-03-02 NOTE — Assessment & Plan Note (Signed)
No evidence of recurrence on periodic follow up

## 2011-03-02 NOTE — Assessment & Plan Note (Signed)
Fairly stable Mostly does ADLs--just needs supervision and meds Delusions about parents, etc but no distress with this

## 2011-03-02 NOTE — Assessment & Plan Note (Signed)
occ gets upset when staff try to redirect inappropriate behavior but otherwise doing well Will continue the lexapro

## 2011-03-02 NOTE — Assessment & Plan Note (Signed)
BP Readings from Last 3 Encounters:  03/02/11 150/78  10/05/10 140/80  03/03/10 140/80   Generally well controlled No changes appropriate

## 2011-03-02 NOTE — Progress Notes (Signed)
Subjective:    Patient ID: Elizabeth Cline, female    DOB: 06/05/1915, 77 y.o.   MRN: [id number]  HPI Daughter unable to make it today--but no concerns Reviewed care and status with Elizabeth Cline the clinical coordinator  No new problems Remains confused at times--will go behind desk and try to look at charts. May get upset when staff correct her Otherwise is cooperative and easy going   Appetite is great Weight is down just slightly  No chest pain No SOB No sig dizziness and no syncope  Gets help from staff for bathing Independent with dressing and bathroom Actually will make bed at times  Current Outpatient Prescriptions on File Prior to Visit  Medication Sig Dispense Refill  . Acetaminophen 500 MG coapsule Take 1 capsule by mouth 2 (two) times daily as needed.        Marland Kitchen aspirin 81 MG tablet Take 1 tablet (81 mg total) by mouth daily.  30 tablet  11  . Calcium Citrate 200 MG TABS Take 1 tablet by mouth 2 (two) times daily.        . cyanocobalamin 1000 MCG tablet Inject 1,000 mcg into the muscle every 30 (thirty) days.        . ergocalciferol (VITAMIN D2) 50000 UNITS capsule Take 50,000 Units by mouth every 30 (thirty) days.        Marland Kitchen escitalopram (LEXAPRO) 10 MG tablet Take 10 mg by mouth daily.        Marland Kitchen loratadine (CLARITIN REDITABS) 10 MG dissolvable tablet Take 10 mg by mouth daily as needed.        . metoprolol tartrate (LOPRESSOR) 25 MG tablet Take 25 mg by mouth 2 (two) times daily.        . Multiple Vitamins-Minerals (OCUVITE PRESERVISION PO) Take 1 tablet by mouth daily.          Not on File  Past Medical History  Diagnosis Date  . Hyperlipidemia   . Hypertension   . Vascular dementia, uncomplicated   . Osteoporosis   . Vitamin B12 deficiency   . Allergy   . Anxiety   . Depression   . LBBB (left bundle branch block) 3/11    transient during oral surgery  . 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 Abdominal  hysterectomy   . Tonsillectomy   . Removal of oral cancer 3/11  . Cataract extraction 2011    Family History  Problem Relation Age of Onset  . Heart disease Father   . Diabetes Father   . Dementia Sister   . Heart disease Brother   . Cancer Neg Hx     History   Social History  . Marital Status: Widowed    Spouse Name: N/A    Number of Children: 2  . Years of Education: N/A   Occupational History  . Librarian in elementary school     retired   Social History Main Topics  . Smoking status: Never Smoker   . Smokeless tobacco: Not on file  . Alcohol Use: No     rare in past  . Drug Use: Not on file  . Sexually Active: Not on file   Other Topics Concern  . Not on file   Social History Narrative   Daughter Elizabeth Cline had guardianship in [place] and was pursuing for [facility]   Review of Systems Bowels are fine No bladder problems Sleeps well  Objective:   Physical Exam  Constitutional: She appears well-developed and well-nourished. No distress.  Neck: Normal range of motion. Neck supple. No thyromegaly present.  Cardiovascular: Normal rate, regular rhythm and normal heart sounds.  Exam reveals no gallop.   No murmur heard. Pulmonary/Chest: Effort normal and breath sounds normal. No respiratory distress. She has no wheezes. She has no rales.  Abdominal: Soft. There is no tenderness.  Musculoskeletal: Normal range of motion. She exhibits no edema and no tenderness.  Lymphadenopathy:    She has no cervical adenopathy.  Neurological: She exhibits normal muscle tone.       Speaks about her parents being here and taking care of her but otherwise appropriate speech and engages fully No weakness  Psychiatric: She has a normal mood and affect. Her behavior is normal. Judgment and thought content normal.          Assessment & Plan:

## 2011-03-02 NOTE — Patient Instructions (Signed)
Will plan follow up in 4-6 months here

## 2011-04-28 DIAGNOSIS — I10 Essential (primary) hypertension: Secondary | ICD-10-CM

## 2011-04-28 DIAGNOSIS — F0391 Unspecified dementia with behavioral disturbance: Secondary | ICD-10-CM

## 2011-04-28 DIAGNOSIS — D510 Vitamin B12 deficiency anemia due to intrinsic factor deficiency: Secondary | ICD-10-CM

## 2011-04-28 DIAGNOSIS — F329 Major depressive disorder, single episode, unspecified: Secondary | ICD-10-CM

## 2011-05-17 ENCOUNTER — Other Ambulatory Visit: Payer: Self-pay | Admitting: *Deleted

## 2011-05-18 MED ORDER — ESCITALOPRAM OXALATE 10 MG PO TABS: 10.0000 mg | ORAL_TABLET | Freq: Every day | ORAL | Status: AC

## 2011-05-18 NOTE — Telephone Encounter (Signed)
Okay to phone in for 1 year supply

## 2011-05-18 NOTE — Telephone Encounter (Signed)
rx called into pharmacy

## 2011-05-30 ENCOUNTER — Other Ambulatory Visit: Payer: Self-pay | Admitting: *Deleted

## 2011-05-30 MED ORDER — ASPIRIN 81 MG PO TABS: 81.0000 mg | ORAL_TABLET | Freq: Every day | ORAL | Status: AC

## 2011-06-01 ENCOUNTER — Other Ambulatory Visit: Payer: Self-pay | Admitting: *Deleted

## 2011-06-01 MED ORDER — CYANOCOBALAMIN 1000 MCG/ML IJ SOLN: 1000.0000 ug | INTRAMUSCULAR | Status: AC

## 2011-06-01 NOTE — Telephone Encounter (Signed)
rx faxed to pharmacy manually  

## 2011-06-19 ENCOUNTER — Other Ambulatory Visit: Payer: Self-pay | Admitting: *Deleted

## 2011-06-19 DIAGNOSIS — F0391 Unspecified dementia with behavioral disturbance: Secondary | ICD-10-CM

## 2011-06-19 DIAGNOSIS — I10 Essential (primary) hypertension: Secondary | ICD-10-CM

## 2011-06-19 DIAGNOSIS — F411 Generalized anxiety disorder: Secondary | ICD-10-CM

## 2011-06-19 DIAGNOSIS — D510 Vitamin B12 deficiency anemia due to intrinsic factor deficiency: Secondary | ICD-10-CM

## 2011-06-19 NOTE — Telephone Encounter (Signed)
Fax on your desk states that pt takes every 2 weeks, please advise.

## 2011-06-19 NOTE — Telephone Encounter (Signed)
Yes Okay for a year

## 2011-06-20 MED ORDER — ERGOCALCIFEROL 1.25 MG (50000 UT) PO CAPS: 50000.0000 [IU] | ORAL_CAPSULE | ORAL | Status: AC

## 2011-06-20 NOTE — Telephone Encounter (Signed)
rx faxed to pharmacy manually  

## 2011-06-23 ENCOUNTER — Other Ambulatory Visit: Payer: Self-pay | Admitting: *Deleted

## 2011-06-23 MED ORDER — METOPROLOL TARTRATE 25 MG PO TABS: 25.0000 mg | ORAL_TABLET | Freq: Two times a day (BID) | ORAL | Status: AC

## 2011-06-30 ENCOUNTER — Other Ambulatory Visit: Payer: Self-pay | Admitting: *Deleted

## 2011-06-30 MED ORDER — OCUVITE PRESERVISION PO TABS: 2.0000 | ORAL_TABLET | Freq: Two times a day (BID) | ORAL | Status: AC

## 2011-06-30 NOTE — Telephone Encounter (Signed)
rx faxed to pharmacy manually  

## 2011-07-22 ENCOUNTER — Inpatient Hospital Stay: Payer: Self-pay | Admitting: Unknown Physician Specialty

## 2011-07-22 LAB — COMPREHENSIVE METABOLIC PANEL
Alkaline Phosphatase: 54 U/L (ref 50–136)
BUN: 23 mg/dL — ABNORMAL HIGH (ref 7–18)
Bilirubin,Total: 0.2 mg/dL (ref 0.2–1.0)
EGFR (African American): >60
EGFR (Non-African Amer.): 57 — ABNORMAL LOW
Glucose: 113 mg/dL — ABNORMAL HIGH (ref 65–99)
Potassium: 4.3 mmol/L (ref 3.5–5.1)
SGOT(AST): 23 U/L (ref 15–37)
SGPT (ALT): 15 U/L
Sodium: 145 mmol/L (ref 136–145)

## 2011-07-22 LAB — PROTIME-INR: Prothrombin Time: 12.1 secs (ref 11.5–14.7)

## 2011-07-22 LAB — CBC WITH DIFFERENTIAL/PLATELET
Eosinophil %: 2.6 %
HGB: 12.5 g/dL (ref 12.0–16.0)
Lymphocyte #: 0.8 10*3/uL — ABNORMAL LOW (ref 1.0–3.6)
MCV: 91 fL (ref 80–100)
Monocyte %: 4.6 %
Neutrophil %: 84.2 %
Platelet: 181 10*3/uL (ref 150–440)
RBC: 4.14 10*6/uL (ref 3.80–5.20)
WBC: 9.4 10*3/uL (ref 3.6–11.0)

## 2011-07-22 LAB — URINALYSIS, COMPLETE
Blood: NEGATIVE
Leukocyte Esterase: NEGATIVE
Nitrite: NEGATIVE
Ph: 6 (ref 4.5–8.0)
RBC,UR: 2 /HPF (ref 0–5)

## 2011-07-22 LAB — TROPONIN I: Troponin-I: <0.02 ng/mL

## 2011-07-26 LAB — CREATININE, SERUM
EGFR (African American): >60
EGFR (Non-African Amer.): >60

## 2011-07-26 LAB — PLATELET COUNT: Platelet: 155 10*3/uL (ref 150–440)

## 2011-07-28 DIAGNOSIS — S7290XA Unspecified fracture of unspecified femur, initial encounter for closed fracture: Secondary | ICD-10-CM

## 2011-07-28 DIAGNOSIS — F05 Delirium due to known physiological condition: Secondary | ICD-10-CM

## 2011-07-28 DIAGNOSIS — F0150 Vascular dementia without behavioral disturbance: Secondary | ICD-10-CM

## 2011-07-28 DIAGNOSIS — I10 Essential (primary) hypertension: Secondary | ICD-10-CM

## 2011-08-04 DIAGNOSIS — F068 Other specified mental disorders due to known physiological condition: Secondary | ICD-10-CM

## 2011-08-04 DIAGNOSIS — I10 Essential (primary) hypertension: Secondary | ICD-10-CM

## 2011-08-04 DIAGNOSIS — F329 Major depressive disorder, single episode, unspecified: Secondary | ICD-10-CM

## 2011-08-21 ENCOUNTER — Telehealth: Payer: Self-pay | Admitting: *Deleted

## 2011-08-21 NOTE — Telephone Encounter (Signed)
Please call daughter Since she is in health care now---the form won't need to be done They can transfer her records from there to memory care if they accept her in transfer

## 2011-08-21 NOTE — Telephone Encounter (Signed)
Daughter dropped off " medical certification" to be filled out, pt is going to [facility] for rehab. Pt normally resided at [facility] but daughter was thinking if the care is good she may want to switch to [facility]. Form on your desk, I filled out as much as I could.   Asher Muir bought the form to me, I didn't talk to the daughter)

## 2011-08-21 NOTE — Telephone Encounter (Signed)
Spoke with daughter, and she just wanted to make sure the records would transfer

## 2011-10-16 ENCOUNTER — Telehealth: Payer: Self-pay

## 2011-10-16 NOTE — Telephone Encounter (Signed)
Spoke to daughter and Marylene Land at [place] Sounds like small stroke Hospital eval doesn't seem indicated Will plan OV tomorrow to eval if not better  Hold metoprolol if systolic BP under 120

## 2011-10-16 NOTE — Telephone Encounter (Signed)
Kelby Aline pts daughter said pts sitter and CMA from Memory care at [facility] said pt having slurred speech, weakness in rt leg, balance affected, and pt cannot figure out words this AM ( pt reads a lot even thou she cannot remember she has good vocabulary).Bosie Clos said saw pt last night and she was her "normal self". These symptoms noticed this AM. Bosie Clos can be reached at [phone] and wants Dr Karle Starch opinion of what to do. Dee said to send to Dr Alphonsus Sias and she will speak with Dr Alphonsus Sias also.

## 2011-10-17 ENCOUNTER — Ambulatory Visit (INDEPENDENT_AMBULATORY_CARE_PROVIDER_SITE_OTHER): Payer: MEDICARE | Admitting: Internal Medicine

## 2011-10-17 ENCOUNTER — Encounter: Payer: Self-pay | Admitting: Internal Medicine

## 2011-10-17 ENCOUNTER — Telehealth: Payer: Self-pay | Admitting: Internal Medicine

## 2011-10-17 VITALS — BP 140/60 | HR 74 | Temp 97.8°F | Wt 116.0 lb

## 2011-10-17 DIAGNOSIS — I639 Cerebral infarction, unspecified: Secondary | ICD-10-CM | POA: Insufficient documentation

## 2011-10-17 DIAGNOSIS — I6350 Cerebral infarction due to unspecified occlusion or stenosis of unspecified cerebral artery: Secondary | ICD-10-CM

## 2011-10-17 DIAGNOSIS — I10 Essential (primary) hypertension: Secondary | ICD-10-CM

## 2011-10-17 LAB — CBC WITH DIFFERENTIAL/PLATELET
Basophils Absolute: 0.1 10*3/uL (ref 0.0–0.1)
Eosinophils Relative: 1.6 % (ref 0.0–5.0)
HCT: 40.2 % (ref 36.0–46.0)
Lymphs Abs: 1.1 10*3/uL (ref 0.7–4.0)
MCV: 91.8 fl (ref 78.0–100.0)
Monocytes Absolute: 0.5 10*3/uL (ref 0.1–1.0)
Monocytes Relative: 7.2 % (ref 3.0–12.0)
Neutrophils Relative %: 75.4 % (ref 43.0–77.0)
Platelets: 226 10*3/uL (ref 150.0–400.0)
RDW: 15.1 % — ABNORMAL HIGH (ref 11.5–14.6)
WBC: 7.4 10*3/uL (ref 4.5–10.5)

## 2011-10-17 LAB — BASIC METABOLIC PANEL
BUN: 17 mg/dL (ref 6–23)
Creatinine, Ser: 0.9 mg/dL (ref 0.4–1.2)
GFR: 63.54 mL/min (ref >60.00)
Glucose, Bld: 112 mg/dL — ABNORMAL HIGH (ref 70–99)

## 2011-10-17 LAB — HEPATIC FUNCTION PANEL
AST: 20 U/L (ref 0–37)
Total Bilirubin: 0.3 mg/dL (ref 0.3–1.2)

## 2011-10-17 NOTE — Telephone Encounter (Signed)
Triage Record Num: [id number] Operator: Tana Felts Patient Name: Elizabeth Cline Call Date & Time: 10/16/2011 6:47:40PM Patient Phone: [phone] PCP: Tillman Abide Patient Gender: Female PCP Fax : [phone] Patient DOB: [birth year] Practice Name: [facility] Reason for Call: Caller: Judith/Other; PCP: Tillman Abide I.; CB#: [phone]; DNR; Lives in Assisted Living Call regarding Stroke; Right side weakness, speech slurred, right facial droop. Weakness, unable to use walker. Unable to bear weight on right side. Spoke with Dr. Alphonsus Sias regarding symptoms, advised to monitor symptoms since there is not a lot of treatment that could be done. Pt has 24 hour care givers. This evening experienced same symptoms, weakness, right facial droop, speech impairment. Triaged Neurological Deficits Protocol. Called MD oncall Dr. Felicity Coyer to discuss DNR status. Orders given to continue comfort measures for patient. If patient is not comfortable in current state then the family may have her transfered to ED. Advised to follow up with MD in AM to update on condition. Protocol(s) Used: Neurological Deficits Recommended Outcome per Protocol: Activate EMS 911 Override Outcome if Used in Protocol: Call Provider Immediately RN Reason for Override Outcome: Physician Orders. Reason for Outcome: New paralysis (unable to move) or weakness (not due to pain) that involves any part of the body Care Advice: ~ 10/16/2011 7:14:16PM Page 1 of 1 [email or web address]

## 2011-10-17 NOTE — Assessment & Plan Note (Addendum)
Persistent right hemiparesis, and right CN deficits On ASA already Some concern about her stools---will check labs for anemia (hopefully not blood)  May not be able to stay in assisted living E-mail to admissions coordinator at [facility]  Will consult PT and OT Very much homebound

## 2011-10-17 NOTE — Assessment & Plan Note (Signed)
BP Readings from Last 3 Encounters:  10/17/11 140/60  03/02/11 150/78  10/05/10 140/80   This is acceptable today No changes

## 2011-10-17 NOTE — Progress Notes (Signed)
Subjective:    Patient ID: Elizabeth Cline, female    DOB: [birth year], 78 y.o.   MRN: [id number]  HPI Here with daughter Elizabeth Cline Awoke yesterday AM with intermittent garbled speech and right sided weakness  Has 24 hour sitter at [place] [facility] Also had right side facial droop  Speech did improve in afternoon but weakness persisted About 6:30PM the speech worsened again---but better when daughter got there ~45 minutes ago  No pain No SOB No chest pain  Has not been able to walk with walker--can assist somewhat with standing and transferring--like to the cammode  Has had some black stools "inky black" daughter mentions the aide stating "more like bile" than blood No loose stools  Current Outpatient Prescriptions on File Prior to Visit  Medication Sig Dispense Refill  . aspirin 81 MG tablet Take 1 tablet (81 mg total) by mouth daily.  30 tablet  11  . Calcium Citrate 200 MG TABS Take 1 tablet by mouth 2 (two) times daily.        . cyanocobalamin (,VITAMIN B-12,) 1000 MCG/ML injection Inject 1 mL (1,000 mcg total) into the muscle every 30 (thirty) days.  10 mL  1  . ergocalciferol (VITAMIN D2) 50000 UNITS capsule Take 1 capsule (50,000 Units total) by mouth every 14 (fourteen) days.  24 capsule  0  . escitalopram (LEXAPRO) 10 MG tablet Take 1 tablet (10 mg total) by mouth daily.  30 tablet  11  . metoprolol tartrate (LOPRESSOR) 25 MG tablet Take 1 tablet (25 mg total) by mouth 2 (two) times daily.  60 tablet  12  . Multiple Vitamins-Minerals (OCUVITE PRESERVISION) TABS Take 2 tablets by mouth 2 (two) times daily.  120 each  11    No Known Allergies  Past Medical History  Diagnosis Date  . Hyperlipidemia   . Hypertension   . Vascular dementia, uncomplicated   . Osteoporosis   . Vitamin B12 deficiency   . Allergy   . Anxiety   . Depression   . LBBB (left bundle branch block) 3/11    transient during oral surgery  . 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 Abdominal hysterectomy   . Tonsillectomy   . Removal of oral cancer 3/11  . Cataract extraction 2011    Family History  Problem Relation Age of Onset  . Heart disease Father   . Diabetes Father   . Dementia Sister   . Heart disease Brother   . Cancer Neg Hx     History   Social History  . Marital Status: Widowed    Spouse Name: N/A    Number of Children: 2  . Years of Education: N/A   Occupational History  . Librarian in elementary school     retired   Social History Main Topics  . Smoking status: Never Smoker   . Smokeless tobacco: Not on file  . Alcohol Use: No     rare in past  . Drug Use: Not on file  . Sexually Active: Not on file   Other Topics Concern  . Not on file   Social History Narrative   Daughter Elizabeth Cline had guardianship in [place] and was pursuing for [facility]   Review of Systems No headache Appetite is somewhat decreased since event    Objective:   Physical Exam  Constitutional: She appears well-developed and well-nourished. No distress.  Eyes: EOM are normal. Pupils are equal,  round, and reactive to light.  Neck: Normal range of motion. Neck supple.  Cardiovascular: Normal rate and regular rhythm.  Exam reveals no gallop.   Murmur heard.      Gr 2/6 aortic systolic murmur  Pulmonary/Chest: Effort normal and breath sounds normal. No respiratory distress. She has no wheezes. She has no rales.  Lymphadenopathy:    She has no cervical adenopathy.  Neurological: She displays no tremor. A cranial nerve deficit is present. She exhibits normal muscle tone.       CN V11 and X1 deficits on right Right side weakness--3+/5 in arm with clear finger to nose dysmetria Leg 3/5 Gait not tested          Assessment & Plan:

## 2011-10-17 NOTE — Telephone Encounter (Signed)
Please call daughter and add on at end of my schedule if she wants to bring her in for evaluation. I can see her at [facility] also--if needed

## 2011-10-17 NOTE — Telephone Encounter (Signed)
appt scheduled for 9:30am

## 2011-10-18 ENCOUNTER — Encounter: Payer: Self-pay | Admitting: *Deleted

## 2011-10-24 DIAGNOSIS — F0150 Vascular dementia without behavioral disturbance: Secondary | ICD-10-CM

## 2011-10-24 DIAGNOSIS — F411 Generalized anxiety disorder: Secondary | ICD-10-CM

## 2011-10-24 DIAGNOSIS — I69959 Hemiplegia and hemiparesis following unspecified cerebrovascular disease affecting unspecified side: Secondary | ICD-10-CM

## 2011-10-24 DIAGNOSIS — I10 Essential (primary) hypertension: Secondary | ICD-10-CM

## 2011-11-14 DIAGNOSIS — F0150 Vascular dementia without behavioral disturbance: Secondary | ICD-10-CM

## 2011-11-14 DIAGNOSIS — F411 Generalized anxiety disorder: Secondary | ICD-10-CM

## 2011-11-14 DIAGNOSIS — I10 Essential (primary) hypertension: Secondary | ICD-10-CM

## 2011-11-14 DIAGNOSIS — I6990 Unspecified sequelae of unspecified cerebrovascular disease: Secondary | ICD-10-CM

## 2011-12-06 DIAGNOSIS — Z9181 History of falling: Secondary | ICD-10-CM

## 2011-12-06 DIAGNOSIS — I10 Essential (primary) hypertension: Secondary | ICD-10-CM

## 2011-12-06 DIAGNOSIS — F068 Other specified mental disorders due to known physiological condition: Secondary | ICD-10-CM

## 2011-12-06 DIAGNOSIS — F329 Major depressive disorder, single episode, unspecified: Secondary | ICD-10-CM

## 2012-01-11 DIAGNOSIS — F411 Generalized anxiety disorder: Secondary | ICD-10-CM

## 2012-01-11 DIAGNOSIS — I6990 Unspecified sequelae of unspecified cerebrovascular disease: Secondary | ICD-10-CM

## 2012-01-11 DIAGNOSIS — I10 Essential (primary) hypertension: Secondary | ICD-10-CM

## 2012-01-11 DIAGNOSIS — F0150 Vascular dementia without behavioral disturbance: Secondary | ICD-10-CM

## 2012-02-12 DIAGNOSIS — I6990 Unspecified sequelae of unspecified cerebrovascular disease: Secondary | ICD-10-CM

## 2012-02-12 DIAGNOSIS — F411 Generalized anxiety disorder: Secondary | ICD-10-CM

## 2012-02-12 DIAGNOSIS — N6459 Other signs and symptoms in breast: Secondary | ICD-10-CM

## 2012-02-12 DIAGNOSIS — F0150 Vascular dementia without behavioral disturbance: Secondary | ICD-10-CM

## 2012-03-13 ENCOUNTER — Telehealth: Payer: Self-pay

## 2012-03-13 NOTE — Telephone Encounter (Signed)
Left detailed message on daughter VM, advised her to call if any questions

## 2012-03-13 NOTE — Telephone Encounter (Signed)
Pts daughter,Judith said Dr Alphonsus Sias monitoring discharge from left breast; 2 times in last 2 weeks,dried blood spots on underwear at left breast. Bosie Clos does not want mammogram but wonders if her mother might have some infection there.Please advise.

## 2012-03-13 NOTE — Telephone Encounter (Signed)
Let her know I will check it at [facility] tomorrow Remind her that letting the nurses there know any concerns is probably more efficient for getting any reevaluation at [facility]

## 2012-03-15 ENCOUNTER — Encounter: Payer: Self-pay | Admitting: Internal Medicine

## 2012-03-15 ENCOUNTER — Telehealth: Payer: Self-pay

## 2012-03-15 DIAGNOSIS — Z0279 Encounter for issue of other medical certificate: Secondary | ICD-10-CM

## 2012-03-15 NOTE — Telephone Encounter (Signed)
I notified Elizabeth Cline that the letter is ready and of charge.

## 2012-03-15 NOTE — Telephone Encounter (Signed)
Letter done $20 charge 

## 2012-03-15 NOTE — Telephone Encounter (Signed)
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 Bosie Clos is pt's POA and has sent POA papers to bank in [place] already but bank request letter from pt's doctor also. Call when ready for pick up.

## 2012-03-18 DIAGNOSIS — K122 Cellulitis and abscess of mouth: Secondary | ICD-10-CM

## 2012-05-16 DIAGNOSIS — I10 Essential (primary) hypertension: Secondary | ICD-10-CM

## 2012-05-16 DIAGNOSIS — F0150 Vascular dementia without behavioral disturbance: Secondary | ICD-10-CM

## 2012-05-16 DIAGNOSIS — I6990 Unspecified sequelae of unspecified cerebrovascular disease: Secondary | ICD-10-CM

## 2012-05-16 DIAGNOSIS — F411 Generalized anxiety disorder: Secondary | ICD-10-CM

## 2012-07-16 DIAGNOSIS — F0150 Vascular dementia without behavioral disturbance: Secondary | ICD-10-CM

## 2012-07-16 DIAGNOSIS — I10 Essential (primary) hypertension: Secondary | ICD-10-CM

## 2012-07-16 DIAGNOSIS — F411 Generalized anxiety disorder: Secondary | ICD-10-CM

## 2012-07-16 DIAGNOSIS — I6990 Unspecified sequelae of unspecified cerebrovascular disease: Secondary | ICD-10-CM

## 2012-08-19 ENCOUNTER — Telehealth: Payer: Self-pay

## 2012-08-19 NOTE — Telephone Encounter (Signed)
Elizabeth Cline request refill on Lorazepam that pt was started on since admission to [facility] had gotten letter from express script.Advised Elizabeth Cline to contact [facility] re: to request. Elizabeth Cline will contact [facility].

## 2012-09-24 DIAGNOSIS — F411 Generalized anxiety disorder: Secondary | ICD-10-CM

## 2012-09-24 DIAGNOSIS — I10 Essential (primary) hypertension: Secondary | ICD-10-CM

## 2012-09-24 DIAGNOSIS — F0150 Vascular dementia without behavioral disturbance: Secondary | ICD-10-CM

## 2012-09-24 DIAGNOSIS — I6990 Unspecified sequelae of unspecified cerebrovascular disease: Secondary | ICD-10-CM

## 2012-11-07 DIAGNOSIS — I10 Essential (primary) hypertension: Secondary | ICD-10-CM

## 2012-11-07 DIAGNOSIS — F0151 Vascular dementia with behavioral disturbance: Secondary | ICD-10-CM

## 2012-11-07 DIAGNOSIS — F411 Generalized anxiety disorder: Secondary | ICD-10-CM

## 2012-11-07 DIAGNOSIS — I6990 Unspecified sequelae of unspecified cerebrovascular disease: Secondary | ICD-10-CM

## 2012-11-07 DIAGNOSIS — R410 Disorientation, unspecified: Secondary | ICD-10-CM

## 2012-12-12 ENCOUNTER — Emergency Department: Payer: Self-pay | Admitting: Emergency Medicine

## 2012-12-12 LAB — COMPREHENSIVE METABOLIC PANEL
Anion Gap: 6 — ABNORMAL LOW (ref 7–16)
Bilirubin,Total: 0.3 mg/dL (ref 0.2–1.0)
Chloride: 106 mmol/L (ref 98–107)
EGFR (African American): 59 — ABNORMAL LOW
EGFR (Non-African Amer.): 51 — ABNORMAL LOW
Glucose: 112 mg/dL — ABNORMAL HIGH (ref 65–99)
SGPT (ALT): 22 U/L (ref 12–78)
Total Protein: 6.3 g/dL — ABNORMAL LOW (ref 6.4–8.2)

## 2012-12-12 LAB — URINALYSIS, COMPLETE
Blood: NEGATIVE
Glucose,UR: NEGATIVE mg/dL (ref 0–75)
Hyaline Cast: 1
Ketone: NEGATIVE
Nitrite: NEGATIVE
Ph: 6 (ref 4.5–8.0)
WBC UR: 5 /HPF (ref 0–5)

## 2012-12-12 LAB — CBC
HCT: 33.8 % — ABNORMAL LOW (ref 35.0–47.0)
MCH: 29.9 pg (ref 26.0–34.0)
Platelet: 218 10*3/uL (ref 150–440)
RDW: 14.1 % (ref 11.5–14.5)
WBC: 12 10*3/uL — ABNORMAL HIGH (ref 3.6–11.0)

## 2013-01-01 ENCOUNTER — Telehealth: Payer: Self-pay | Admitting: *Deleted

## 2013-01-01 NOTE — Telephone Encounter (Signed)
I can meet with her next Thursday morning at [facility] She should let Dondra Spry know if that works and I will specify a time (like somewhere between 8:30 and 10:30AM)

## 2013-01-01 NOTE — Telephone Encounter (Signed)
Spoke with daughter and advised results, she will call gail

## 2013-01-01 NOTE — Telephone Encounter (Signed)
Daughter calling asking for an appt here or to arrange appt at [facility] to discuss palative care for her mother. Please advise [phone]

## 2013-03-04 DIAGNOSIS — F0150 Vascular dementia without behavioral disturbance: Secondary | ICD-10-CM

## 2013-03-04 DIAGNOSIS — I6990 Unspecified sequelae of unspecified cerebrovascular disease: Secondary | ICD-10-CM

## 2013-03-04 DIAGNOSIS — F411 Generalized anxiety disorder: Secondary | ICD-10-CM

## 2013-03-04 DIAGNOSIS — I10 Essential (primary) hypertension: Secondary | ICD-10-CM

## 2013-04-21 ENCOUNTER — Ambulatory Visit: Payer: Medicare FFS | Admitting: Internal Medicine

## 2013-05-06 DIAGNOSIS — F0151 Vascular dementia with behavioral disturbance: Secondary | ICD-10-CM

## 2013-05-06 DIAGNOSIS — I69998 Other sequelae following unspecified cerebrovascular disease: Secondary | ICD-10-CM

## 2013-05-06 DIAGNOSIS — F411 Generalized anxiety disorder: Secondary | ICD-10-CM

## 2013-05-06 DIAGNOSIS — G479 Sleep disorder, unspecified: Secondary | ICD-10-CM

## 2013-05-06 DIAGNOSIS — R410 Disorientation, unspecified: Secondary | ICD-10-CM

## 2013-05-06 DIAGNOSIS — I10 Essential (primary) hypertension: Secondary | ICD-10-CM

## 2013-05-06 DIAGNOSIS — R319 Hematuria, unspecified: Secondary | ICD-10-CM

## 2013-05-15 DIAGNOSIS — R05 Cough: Secondary | ICD-10-CM

## 2013-07-01 DIAGNOSIS — J209 Acute bronchitis, unspecified: Secondary | ICD-10-CM

## 2013-07-10 DIAGNOSIS — F0150 Vascular dementia without behavioral disturbance: Secondary | ICD-10-CM

## 2013-07-10 DIAGNOSIS — F411 Generalized anxiety disorder: Secondary | ICD-10-CM

## 2013-07-10 DIAGNOSIS — I10 Essential (primary) hypertension: Secondary | ICD-10-CM

## 2013-07-10 DIAGNOSIS — I6990 Unspecified sequelae of unspecified cerebrovascular disease: Secondary | ICD-10-CM

## 2013-09-02 DIAGNOSIS — F0150 Vascular dementia without behavioral disturbance: Secondary | ICD-10-CM

## 2013-09-02 DIAGNOSIS — G478 Other sleep disorders: Secondary | ICD-10-CM

## 2013-09-02 DIAGNOSIS — I10 Essential (primary) hypertension: Secondary | ICD-10-CM

## 2013-09-02 DIAGNOSIS — F411 Generalized anxiety disorder: Secondary | ICD-10-CM

## 2013-09-02 DIAGNOSIS — I69998 Other sequelae following unspecified cerebrovascular disease: Secondary | ICD-10-CM

## 2013-11-06 DIAGNOSIS — G479 Sleep disorder, unspecified: Secondary | ICD-10-CM

## 2013-11-06 DIAGNOSIS — I10 Essential (primary) hypertension: Secondary | ICD-10-CM

## 2013-11-06 DIAGNOSIS — F411 Generalized anxiety disorder: Secondary | ICD-10-CM

## 2013-11-06 DIAGNOSIS — F0150 Vascular dementia without behavioral disturbance: Secondary | ICD-10-CM

## 2013-11-06 DIAGNOSIS — I6990 Unspecified sequelae of unspecified cerebrovascular disease: Secondary | ICD-10-CM

## 2014-01-07 DIAGNOSIS — I69998 Other sequelae following unspecified cerebrovascular disease: Secondary | ICD-10-CM

## 2014-01-07 DIAGNOSIS — F411 Generalized anxiety disorder: Secondary | ICD-10-CM

## 2014-01-07 DIAGNOSIS — G479 Sleep disorder, unspecified: Secondary | ICD-10-CM

## 2014-01-07 DIAGNOSIS — F0150 Vascular dementia without behavioral disturbance: Secondary | ICD-10-CM

## 2014-01-07 DIAGNOSIS — I10 Essential (primary) hypertension: Secondary | ICD-10-CM

## 2014-03-05 DIAGNOSIS — G479 Sleep disorder, unspecified: Secondary | ICD-10-CM

## 2014-03-05 DIAGNOSIS — I6990 Unspecified sequelae of unspecified cerebrovascular disease: Secondary | ICD-10-CM

## 2014-03-05 DIAGNOSIS — F411 Generalized anxiety disorder: Secondary | ICD-10-CM

## 2014-03-05 DIAGNOSIS — I10 Essential (primary) hypertension: Secondary | ICD-10-CM

## 2014-03-05 DIAGNOSIS — F0150 Vascular dementia without behavioral disturbance: Secondary | ICD-10-CM

## 2014-05-06 DIAGNOSIS — I69398 Other sequelae of cerebral infarction: Secondary | ICD-10-CM

## 2014-05-06 DIAGNOSIS — G479 Sleep disorder, unspecified: Secondary | ICD-10-CM

## 2014-05-06 DIAGNOSIS — F0151 Vascular dementia with behavioral disturbance: Secondary | ICD-10-CM

## 2014-05-06 DIAGNOSIS — I10 Essential (primary) hypertension: Secondary | ICD-10-CM

## 2014-05-06 DIAGNOSIS — F419 Anxiety disorder, unspecified: Secondary | ICD-10-CM

## 2014-07-21 DIAGNOSIS — F0151 Vascular dementia with behavioral disturbance: Secondary | ICD-10-CM

## 2014-07-21 DIAGNOSIS — I6930 Unspecified sequelae of cerebral infarction: Secondary | ICD-10-CM

## 2014-07-21 DIAGNOSIS — I10 Essential (primary) hypertension: Secondary | ICD-10-CM

## 2014-07-21 DIAGNOSIS — G479 Sleep disorder, unspecified: Secondary | ICD-10-CM

## 2014-07-21 DIAGNOSIS — F411 Generalized anxiety disorder: Secondary | ICD-10-CM

## 2014-09-02 DIAGNOSIS — G479 Sleep disorder, unspecified: Secondary | ICD-10-CM | POA: Diagnosis not present

## 2014-09-02 DIAGNOSIS — I10 Essential (primary) hypertension: Secondary | ICD-10-CM | POA: Diagnosis not present

## 2014-09-02 DIAGNOSIS — F323 Major depressive disorder, single episode, severe with psychotic features: Secondary | ICD-10-CM

## 2014-09-02 DIAGNOSIS — F0150 Vascular dementia without behavioral disturbance: Secondary | ICD-10-CM | POA: Diagnosis not present

## 2014-09-02 DIAGNOSIS — I69398 Other sequelae of cerebral infarction: Secondary | ICD-10-CM | POA: Diagnosis not present

## 2014-10-06 DIAGNOSIS — J069 Acute upper respiratory infection, unspecified: Secondary | ICD-10-CM | POA: Diagnosis not present

## 2014-10-09 DIAGNOSIS — J209 Acute bronchitis, unspecified: Secondary | ICD-10-CM | POA: Diagnosis not present

## 2014-10-25 NOTE — Consult Note (Signed)
PATIENT NAME:  Elizabeth Cline, Elizabeth Cline MR#:  [id number] DATE OF BIRTH:  1915/01/02  DATE OF CONSULTATION:  07/22/2011  REFERRING PHYSICIAN:   CONSULTING PHYSICIAN:  Elizabeth Lighter, MD  ADMITTING PHYSICIAN: Dr. Mauri Cline.  PRIMARY CARE PHYSICIAN: Dr. Viviana Cline, [facility].   REASON FOR CONSULTATION:  Preoperative clearance for right hip fracture surgery.   BRIEF HISTORY: Elizabeth Cline is an elderly 81 year old Caucasian female with past medical history significant for dementia, depression and hypertension who is at [facility] who was brought in after she had a fall. The patient has dementia so does not remember exactly why she fell, but gathering information from the ER physician and talking to the patient's daughter, Elizabeth Cline. It seems like the patient was walking in the hallway which she normally does and had a mechanical fall. She was brought in for right hip pain and the x-rays here show a right intertrochanteric hip fracture. She does not have any cardiac history. No complaints of chest pain or dyspnea on exertion. Chest x-ray shows mild cardiomegaly, probably related to age, but she has been asymptomatic. Blood pressure was elevated when she was brought in at 226/92. Part of it is secondary to pain and distress. She is comfortably resting currently in bed and denies any complaints.   PAST MEDICAL HISTORY:  1. Depression.  2. Dementia.  3. Hypertension.  4. Osteoporosis.   PAST SURGICAL HISTORY:  1. Cataract surgery.  2. Hysterectomy when she was young.  3. Small oral cancer on the roof of the mouth that was completely resected surgically by Dr. Tami Ribas from ENT.   ALLERGIES TO MEDICATIONS: No known drug allergies.   CURRENT MEDICATIONS:  1. Metoprolol 25 mg p.o. b.i.d.  2. Lexapro 10 mg p.o. daily.  3. Aspirin 81 mg p.o. daily.  4. Peroxyl mouthwash, rinse one-half ounce twice a day.  5. Calcium citrate 1 tablet p.o. b.i.d.  6. Vitamin D 1.25 mg with 50,000  international units 1 tablet every two weeks.  7. Cyanocobalamin 1000 mcg intramuscularly every month.  8. Claritin 10 mg as needed for allergies.  9. Tylenol capsule as needed for pain.   SOCIAL HISTORY: She has been at [employer]. Usually is independent in ambulating without the use of any cane or walker. No smoking or alcohol use.   FAMILY HISTORY: Dad died in his 33s from diabetes and heart disease, congestive heart failure. Mom died in her 51s secondary to a stroke or heart attack.   REVIEW OF SYSTEMS: CONSTITUTIONAL: Denies any fever or weakness. EYES: Status post cataract surgery. Vision is good. No inflammation or glaucoma. ENT: Mild hearing loss bilaterally. No epistaxis or discharge. RESPIRATORY: No cough, wheeze, hemoptysis, or chronic obstructive pulmonary disease. CARDIOVASCULAR: No chest pain, orthopnea, edema, arrhythmia, palpitations, or syncope. GASTROINTESTINAL: No nausea, vomiting, diarrhea, abdominal pain, hematemesis, or melena. GU: Positive for increased frequency and also incontinence. No dysuria or hematuria. ENDOCRINE: No polyuria, nocturia, thyroid problems, heat or cold intolerance. HEMATOLOGY: No anemia, easy bruising or bleeding. SKIN: No acne, rash, or lesions. MUSCULOSKELETAL: Positive for right hip pain secondary to fracture. NEUROLOGIC: No numbness, weakness, cerebrovascular accident, transient ischemic attack, or seizures. PSYCHOLOGICAL: No anxiety, insomnia, or depression.   PHYSICAL EXAMINATION:   VITAL SIGNS: Temperature 97.1, pulse 67, respirations 20, blood pressure 226/92, pulse oximetry 94% on room air.   GENERAL: Thin built, well nourished female lying in bed, not in any acute distress.   HEENT: Normocephalic, atraumatic. Pupils post surgical, equal, reacting  to light. Anicteric sclerae. Extraocular movements intact. Oropharynx clear without erythema, mass, or exudates.   NECK: Supple. No thyromegaly, jugular venous distention or carotid  bruits. No lymphadenopathy.   LUNGS: Clear to auscultation bilaterally. No wheeze or crackles. No use of accessory muscles for breathing.   CARDIOVASCULAR: S1, S2, 3/6 systolic murmur in the precordial region. No rubs or gallops.   ABDOMEN: Soft, nontender, nondistended. No hepatosplenomegaly. Normal bowel sounds.   EXTREMITIES: No pedal edema. No clubbing or cyanosis. 2+ dorsalis pedis pulses palpable bilaterally. Right hip is abducted, externally rotated and shorter than the left leg.   SKIN: No acne, rash, or lesions.   LYMPHATICS: No cervical lymphadenopathy.   NEUROLOGIC: Cranial nerves appear intact. No focal motor or sensory deficits.   PSYCHOLOGICAL: The patient is alert and pleasantly confused, oriented only to person, but seems like that is her baseline.   LABORATORY, RADIOLOGICAL AND DIAGNOSTIC DATA: WBC 9.4, hemoglobin 12.5, hematocrit 37.8, platelet count 181. Sodium 142, potassium 4.3, chloride 108, bicarbonate 26, BUN 23, creatinine 0.97, glucose 113, calcium 8.6. ALT 15t, AST 23, alkaline phosphatase 54, total bilirubin 0.2, albumin 3.4, INR 0.9. Troponin first set is negative. Chest x-ray showing clear lung fields, mild cardiomegaly. Right hip x-ray showing angulated right intertrochanteric hip fracture. Pelvis x-ray showing degenerative changes of lumbar spine and both hips and right hip intertrochanteric angulated fracture. EKG showing normal sinus rhythm. No ST-T wave abnormalities, heart rate of 60.   ASSESSMENT AND RECOMMENDATIONS: 81 year old female with history of dementia and hypertension admitted after right hip intertrochanteric fracture. Medicine consultation is requested for preop evaluation.  1. Preoperative evaluation for right hip surgery. Her only risk factors are her age and also high blood pressure. She does not have any cardiac history. No chest pains or symptoms related to congestive heart failure. With better blood pressure control, she is okay to proceed  with the surgery because of the acute fracture and the pain.  2. Accelerated hypertension, partly secondary to the pain, so we will continue her metoprolol and added Norvasc for better blood pressure control and will do IV hydralazine p.r.n. Monitor blood pressure postoperatively as there is a chance that it might drop.  3. Dementia and depression. Continue Lexapro. Her mental status is at baseline.  4. Right hip fracture. Management per orthopedics. Pain medications for open reduction and internal fixation tomorrow and postoperative deep vein thrombosis prophylaxis and physical therapy as needed.  5. CODE STATUS is DO NOT RESUSCITATE. She has out of facility DO NOT RESUSCITATE order signed and in the chart.   TIME SPENT ON CONSULTATION: 50 minutes.    ____________________________ Elizabeth Lighter, MD rk:ap D: 07/22/2011 18:06:45 ET T: 07/23/2011 09:43:33 ET JOB#: [id number]  cc: Elizabeth Lighter, MD, <Dictator> Venia Carbon, MD Alysia Penna. Elizabeth Pole, MD Elizabeth Lighter MD ELECTRONICALLY SIGNED 07/23/2011 13:47

## 2014-10-25 NOTE — H&P (Signed)
PATIENT NAME:  Elizabeth Cline, Elizabeth Cline MR#:  [id number] DATE OF BIRTH:  06-08-1915  DATE OF ADMISSION:  07/22/2011  DICTATING THE COMPLETION OF A HISTORY AND PHYSICAL   ORTHOPEDIC EXAMINATION: CERVICAL SPINE: Good range of motion with no discomfort.   THORACIC SPINE: Mild kyphosis with no significant tenderness.   LUMBOSACRAL SPINE: Nontender.   SHOULDERS, ELBOWS, WRISTS AND HANDS: Overall have good range of motion with normal neurovascular examination, mild diffuse degenerative changes.   LEFT HIP, KNEE, FOOT AND ANKLE: Good range of motion with no tenderness.   RIGHT LOWER EXTREMITY: Right leg is shortened and externally rotated. There is pain in the hip with any attempted range of motion. Knee, foot, and ankle unremarkable.   NEUROLOGICAL: Neurovascular examination of both lower extremities is intact. There is no muscular or skin atrophy.   ____________________________ Alysia Penna. Mauri Pole, MD jcc:cms D: 07/23/2011 09:32:25 ET T: 07/23/2011 09:43:11 ET JOB#: [id number]  cc: Alysia Penna. Mauri Pole, MD, <Dictator> Alysia Penna Janei Scheff MD ELECTRONICALLY SIGNED 07/28/2011 12:28

## 2014-10-25 NOTE — Op Note (Signed)
PATIENT NAME:  Elizabeth Cline, Elizabeth Cline MR#:  [id number] DATE OF BIRTH:  1914/12/18  DATE OF PROCEDURE:  07/23/2011  PREOPERATIVE DIAGNOSIS: Intertrochanteric fracture right hip.   POSTOPERATIVE DIAGNOSIS: Intertrochanteric fracture right hip.   PROCEDURE: Open reduction internal fixation of intertrochanteric hip fracture right thigh with intramedullary hip screw.   SURGEON: Alysia Penna. Angelita Harnack, MD  ASSISTANT: None.   ANESTHESIA: Spinal.   ESTIMATED BLOOD LOSS: 100 mL.  REPLACEMENT: 700 mL crystalloid.   DRAINS: None.   COMPLICATIONS: None.   IMPLANTS USED: Stryker Gamma 3 130 degree short, 35 mm distal screw, 85 mm lag screw.   BRIEF CLINICAL NOTE AND PATHOLOGY: The patient fell on 01/19 suffering the above-mentioned fracture. No other injuries. She was evaluated in the Emergency Room, cleared by medicine. At the time of surgery the fracture did reduce nicely. Hardware had good fixation.   DESCRIPTION OF PROCEDURE: Preop antibiotics, adequate spinal anesthesia, patient transferred to fracture table, fracture was reduced with traction abduction followed by adduction and internal rotation. AP and lateral fluoroscopic views showed essentially anatomic reduction.   The routine prepping and draping was performed. The guidewire was then introduced at the tip of the greater trochanter using fluoroscopic guidance. This was confirmed on AP and lateral views. The soft tissues were protected and the step reamer was used. The angle was measured and the 130 degree nail was chosen. The nail was then inserted. The position was adjusted and guidewire was placed up the head into the neck, checked on AP and lateral views having excellent position. Length was measured. This was followed by drilling and insertion of the lag screw.   The superior locking screw was placed, it was back off one-quarter turn and compression was performed.   The distal transverse screw then placed in routine fashion through a separate  incision. AP, lateral, and oblique views showed good positioning. The incisions were irrigated, closed with 0 Vicryl and skin was closed with staples. Soft sterile dressing was applied. Sponge and needle counts reported as correct prior to and after wound closure. The patient was awakened and taken to the postanesthesia care unit having tolerated the procedure well.    ____________________________ Alysia Penna. Mauri Pole, MD jcc:cms D: 07/23/2011 09:35:00 ET T: 07/23/2011 10:20:43 ET JOB#: [id number]  cc: Alysia Penna. Mauri Pole, MD, <Dictator> Alysia Penna Charlet Harr MD ELECTRONICALLY SIGNED 07/28/2011 12:28

## 2014-10-25 NOTE — H&P (Signed)
Subjective/Chief Complaint Right hip pain    History of Present Illness Resxient of [facility], fell today. Has dementia, so cannot recall mechanism. Has remained alert    Primary Physician [place]   ALLERGIES:  No Known Allergies:   Family and Social History:   Family History Non-Contributory    Social History negative tobacco, negative ETOH, negative Illicit drugs    Place of Living Nursing Home   Review of Systems:   ROS Pt not able to provide ROS   Physical Exam:   GEN WD    HEENT PERRL    NECK supple    RESP clear BS    CARD regular rate    ABD denies tenderness  soft  normal BS    GU foley catheter in place    EXTR Right leg shortened and externally rotated. Pain at groin.    SKIN normal to palpation    NEURO motor/sensory function intact    PSYCH alert   Radiology Results: XRay:    19-Jan-13 16:01, Pelvis AP Only   Pelvis AP Only   REASON FOR EXAM:    fell pain r hip  COMMENTS:       PROCEDURE: DXR - DXR PELVIS AP ONLY  - Jul 22 2011  4:01PM     RESULT:     Right intertrochanteric hip fracture is noted. Angulation deformity is   noted. Diffuse osteopenia and degenerative changenoted.    IMPRESSION:      1. Right hip intertrochanteric angulated fracture.   2. Degenerative change noted of the lumbar spine and both hips.     Thank you for this opportunity to contribute to the care of your patient.           Verified By: Osa Craver, M.D., MD    19-Jan-13 16:10, Chest 1 View AP or PA   Chest 1 View AP or PA   REASON FOR EXAM:    preop  COMMENTS:       PROCEDURE: DXR - DXR CHEST 1 VIEWAP OR PA  - Jul 22 2011  4:10PM     RESULT:     Cardiomegaly is present. The lungs are clear of acute infiltrates.    IMPRESSION: Cardiomegaly.        Thank you for this opportunity to contribute to the care of your patient.         Verified By: Osa Craver, M.D., MD    19-Jan-13 16:11, Hip Right Complete   Hip Right Complete    REASON FOR EXAM:    fell pain  COMMENTS:       PROCEDURE: DXR - DXR HIP RIGHT COMPLETE  - Jul 22 2011  4:11PM     RESULT:     Right intertrochanteric hip fracture is present. The fracture is   angulated. Vascular calcification noted. Degenerative change is noted.    IMPRESSION: Angulated, right intertrochanteric hip fracture.      Thank you for this opportunity to contribute to the care of your patient.       Verified By: Osa Craver, M.D., MD     Assessment/Admission Diagnosis Right intertrochanteric/basilar neck fracture Dementia    Plan [facility] clearance ORIF in AM  Discussed with daughter   Electronic Signatures: Lynnda Shields (MD)  (Signed 19-Jan-13 17:34)  Authored: CHIEF COMPLAINT and HISTORY, ALLERGIES, FAMILY AND SOCIAL HISTORY, REVIEW OF SYSTEMS, PHYSICAL EXAM, Radiology, ASSESSMENT AND PLAN   Last Updated: 19-Jan-13 17:34 by Lynnda Shields (MD)

## 2014-10-25 NOTE — Consult Note (Signed)
Brief Consult Note: Diagnosis: Pre op eval for right hip fracture, HTN, cardiomegaly on CXR, Dementia.   Patient was seen by consultant.   Consult note dictated.   Recommend to proceed with surgery or procedure.   Orders entered.   Discussed with Attending MD.   Comments: 81y/o F from [facility] unit with PMH of dementia, HTN admitted after fall and right hip fracture. medicine consult for pre op eval.  * Pre op eval- risk factors-age and HTN, no cardiac disease history. no chest pain or symptoms of CHF (though mild cardiomegaly seen on CXR) better BP control, ok to proceed with surgery  * Accelerated HTN- partly from pain, cont metoprolol and added norvasc- monitor post op as bP might fall  * Dementia and depression- lexapro, at baseline  * Right hip fracture- after fal, mgmt per ortho, pain meds, ORIF tomorrow and post op DVT prophylaxis nad PT.  Electronic Signatures: Gladstone Lighter (MD)  (Signed 19-Jan-13 17:57)  Authored: Brief Consult Note   Last Updated: 19-Jan-13 17:57 by Gladstone Lighter (MD)

## 2014-10-25 NOTE — Discharge Summary (Signed)
PATIENT NAME:  Elizabeth Cline, Elizabeth Cline MR#:  [id number] DATE OF BIRTH:  [birth year]  DATE OF ADMISSION:  07/22/2011 DATE OF DISCHARGE:  07/26/2011  ADMITTING DIAGNOSIS: Intertrochanteric fracture of the right hip.   DISCHARGE DIAGNOSIS: Intertrochanteric fracture of the right hip.   OPERATION: On 07/23/2011 the patient had an open reduction and internal fixation intertrochanteric fracture of the right thigh with intramedullary hip screw.    SURGEON: Dr. Mauri Pole    ASSISTANT: None.   ANESTHESIA: Spinal.   ESTIMATED BLOOD LOSS: 100 mL.  REPLACEMENT: 700 mL of crystalloid.   DRAINS: None.   COMPLICATIONS: None.   IMPLANTS USED: [employer] Gamma 3 130-degree short, 35 mm distal screw, 85 mm lag screw. Patient was stabilized, sent to the recovery room, brought to the orthopedic floor.   HISTORY: The patient is a 81 year old female that presented after a fall and pain involving the right hip. The patient was from [facility] and could not ambulate. The patient does have dementia.   PHYSICAL EXAMINATION: GENERAL: Well-developed elderly female with confusion. CARDIAC: Regular. LUNGS: Clear. MUSCULOSKELETAL: In regard to the right lower extremity, the patient has shortening and external rotation with pain in the groin and any attempt at motion produces increased pain. NEUROLOGICAL: Neurovascular intact.   HOSPITAL COURSE: After initial admission on 07/22/2011 the patient was brought to the orthopedic floor for medical clearance. The patient is being treated for hypertension and dementia and cleared for surgery the following day. On postoperative day one the patient had a hemoglobin of 8.8 and was transfused 1 unit of blood with her hemoglobin up to 9.4 on postoperative day two because of acute blood loss anemia from the hip fracture. Patient was stable, was ready to go to rehab. On 07/26/2011 the patient was doing well.   CONDITION AT DISCHARGE: Stable.   DISCHARGE INSTRUCTIONS:  1. Patient will follow  up at [place] in two weeks for staple removal.  2. Patient is NO CODE, DO NOT RESUSCITATE.  3. Patient will do physical therapy with right hip as tolerated with a walker.  4. Patient's diet is regular.  5. Patient will use TED hose, thigh-high, bilaterally and Again weight bear as tolerated for activities.   6. Patient of a dressing change on a p.r.n. basis.   DISCHARGE MEDICATIONS:  1. Tylenol 325/650 mg q.4 hours as needed for pain or fever greater than 100.4.  2. Norvasc 5 mg p.o. daily.  3. Bisacodyl suppository 10 mg per rectum on a p.r.n. basis for constipation.  4. Senokot-S 1 tablet p.o. b.i.d.  5. Lovenox 30 mg subcutaneous twice a day for 25 days then discontinue. 6. Lexapro 10 mg p.o. daily. 7. Haldol 1 mg intramuscularly every 3 to 4 hours p.r.n. for agitation. 8. Hydralazine HCL 10 mg every six hours p.r.n.  9. Magnesium hydroxide suspension 30 mL p.o. at bedtime or p.r.n.  10. Metoprolol 50 mg p.o. q.12 hours.  11. Oxycodone 5 mg every three hours p.r.n. for pain. 12. Risperdal 0.25 to 0.5 mg p.o. at bedtime.  13. Vitamin B complex with iron intrinsic factor 1 capsule p.o. b.i.d.   ____________________________ J. Reche Dixon, [place] jtm:cms D: 07/26/2011 07:21:09 ET T: 07/26/2011 07:40:42 ET  JOB#: [id number] cc: J. Reche Dixon, [place], <Dictator> J Keshonna Valvo [facility] [place] ELECTRONICALLY SIGNED 08/02/2011 7:46

## 2014-11-02 DIAGNOSIS — F0150 Vascular dementia without behavioral disturbance: Secondary | ICD-10-CM | POA: Diagnosis not present

## 2014-11-02 DIAGNOSIS — F411 Generalized anxiety disorder: Secondary | ICD-10-CM

## 2014-11-02 DIAGNOSIS — I10 Essential (primary) hypertension: Secondary | ICD-10-CM | POA: Diagnosis not present

## 2014-11-02 DIAGNOSIS — G479 Sleep disorder, unspecified: Secondary | ICD-10-CM | POA: Diagnosis not present

## 2014-11-02 DIAGNOSIS — I69398 Other sequelae of cerebral infarction: Secondary | ICD-10-CM | POA: Diagnosis not present

## 2015-01-01 DIAGNOSIS — G479 Sleep disorder, unspecified: Secondary | ICD-10-CM

## 2015-01-01 DIAGNOSIS — I69398 Other sequelae of cerebral infarction: Secondary | ICD-10-CM

## 2015-01-01 DIAGNOSIS — I10 Essential (primary) hypertension: Secondary | ICD-10-CM

## 2015-01-01 DIAGNOSIS — F419 Anxiety disorder, unspecified: Secondary | ICD-10-CM

## 2015-01-01 DIAGNOSIS — F0150 Vascular dementia without behavioral disturbance: Secondary | ICD-10-CM

## 2015-01-14 DIAGNOSIS — M25571 Pain in right ankle and joints of right foot: Secondary | ICD-10-CM

## 2015-01-27 DIAGNOSIS — R001 Bradycardia, unspecified: Secondary | ICD-10-CM | POA: Diagnosis not present

## 2015-01-27 DIAGNOSIS — I959 Hypotension, unspecified: Secondary | ICD-10-CM | POA: Diagnosis not present

## 2015-03-04 DIAGNOSIS — F0150 Vascular dementia without behavioral disturbance: Secondary | ICD-10-CM | POA: Diagnosis not present

## 2015-03-04 DIAGNOSIS — G479 Sleep disorder, unspecified: Secondary | ICD-10-CM | POA: Diagnosis not present

## 2015-03-04 DIAGNOSIS — I69398 Other sequelae of cerebral infarction: Secondary | ICD-10-CM | POA: Diagnosis not present

## 2015-03-04 DIAGNOSIS — F411 Generalized anxiety disorder: Secondary | ICD-10-CM

## 2015-03-04 DIAGNOSIS — I10 Essential (primary) hypertension: Secondary | ICD-10-CM | POA: Diagnosis not present

## 2015-03-06 IMAGING — CT CT HEAD WITHOUT CONTRAST
2 of 4 series · 16 of 30 positions shown, 19 images · non-contrast
Comparison: none

REASON FOR EXAM: UNWITNESSED FALL
COMMENTS:

PROCEDURE:     CT  - CT HEAD WITHOUT CONTRAST  - December 12, 2012  [DATE]
RESULT:     Comparison:  None
TECHNIQUE: Multiple axial images from the foramen magnum to the vertex were
obtained without IV contrast.

[Series 2: without · axial · non-contrast · 0.44mm/px · z∈[+1398,+1524]mm · 10 of 31 slices shown, 13 images]
[im 3/31  brain]
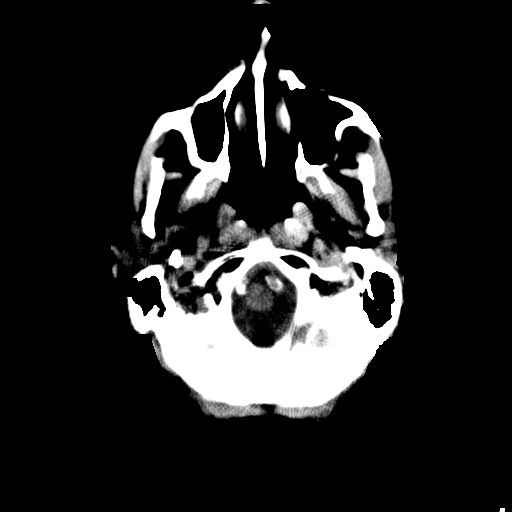
[im 3/31  bone]
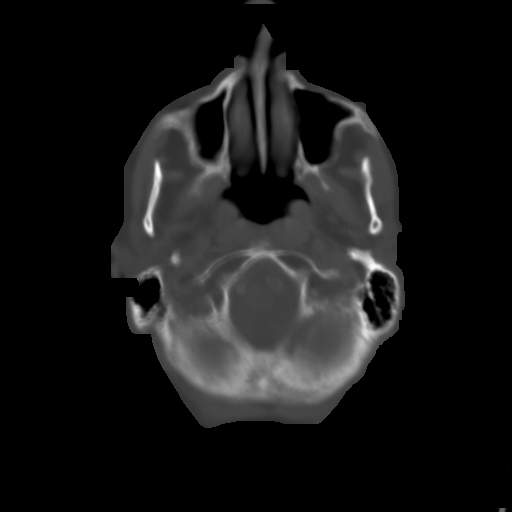
[im 6/31  brain]
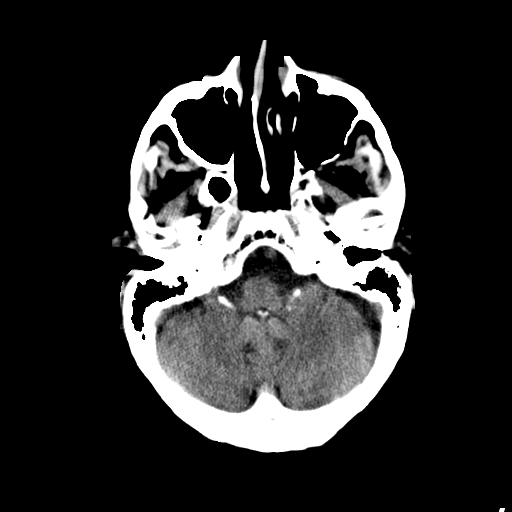
[im 9/31  brain]
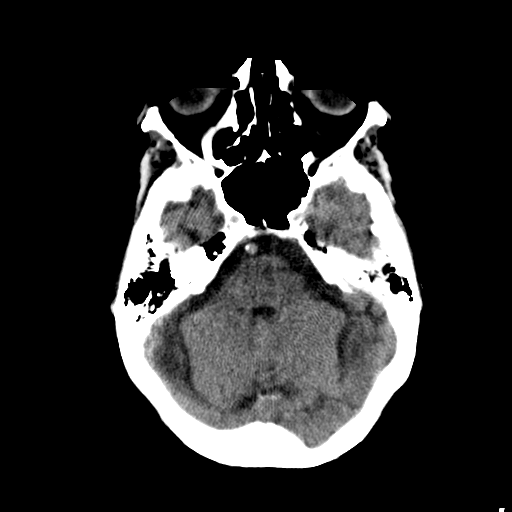
[im 11/31  brain]
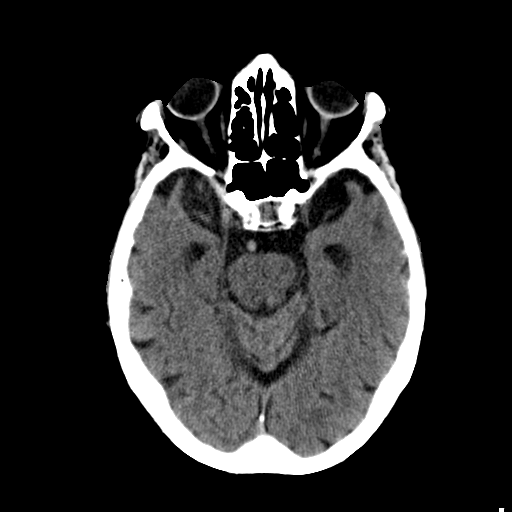
[im 14/31  brain]
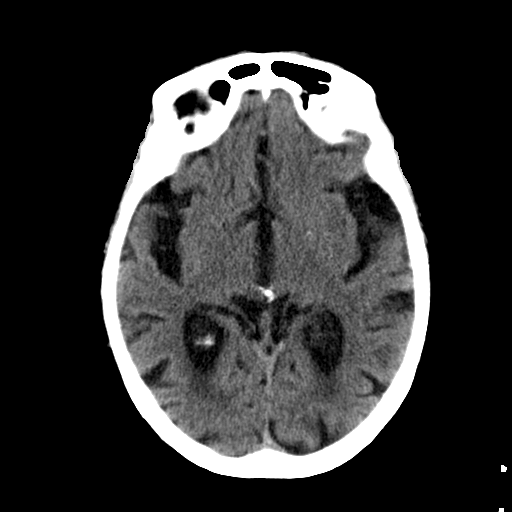
[im 14/31  bone]
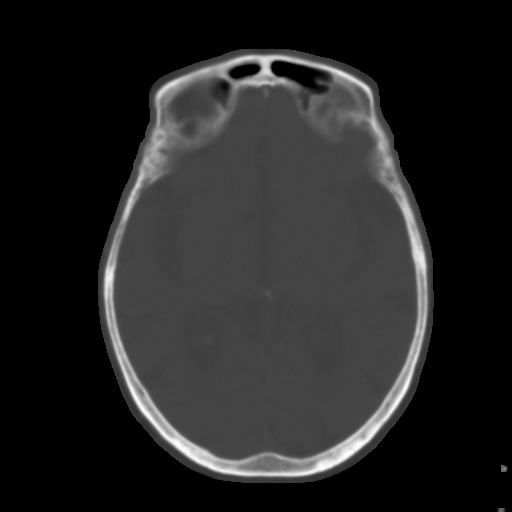
[im 17/31  brain]
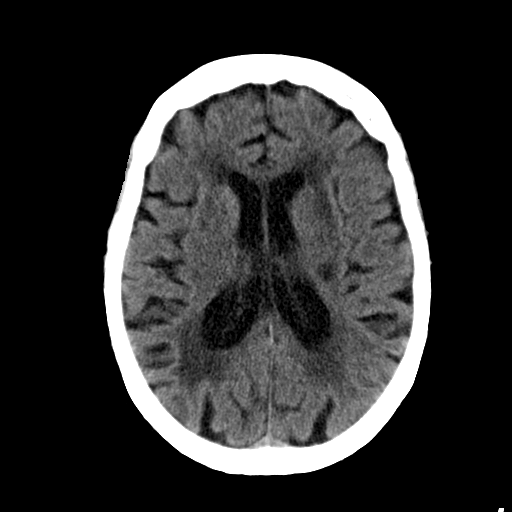
[im 20/31  brain]
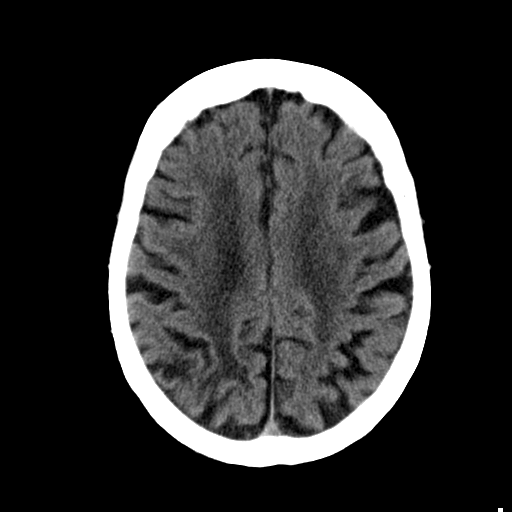
[im 22/31  brain]
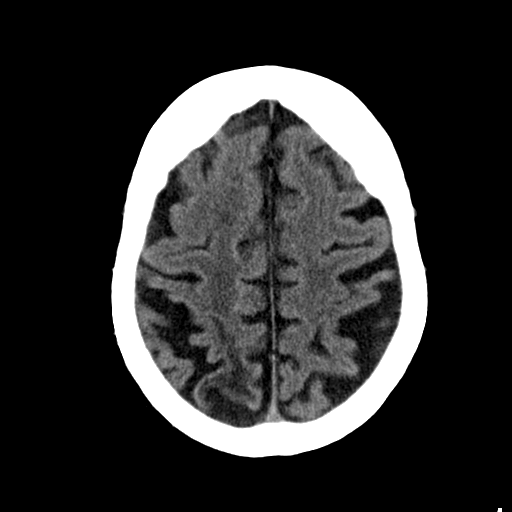
[im 25/31  brain]
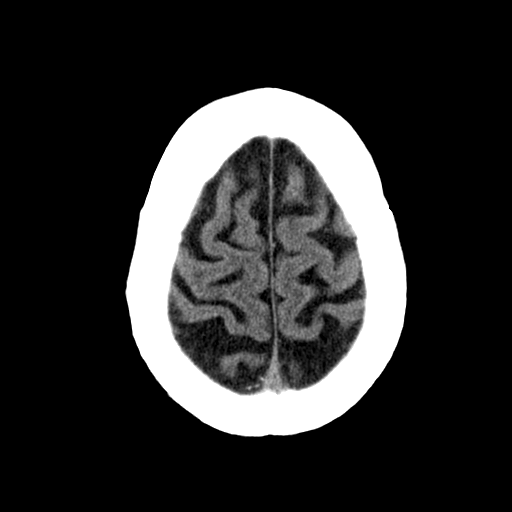
[im 25/31  bone]
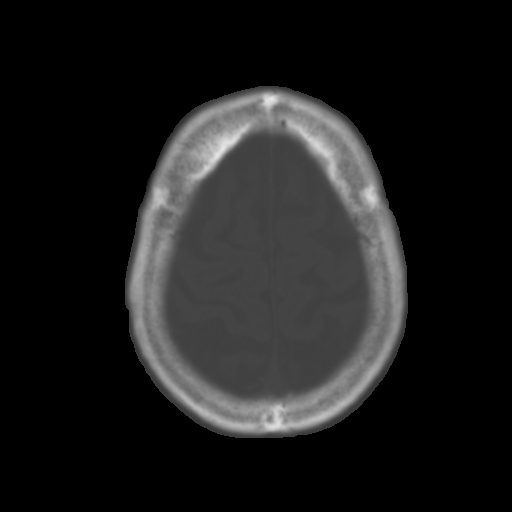
[im 28/31  brain]
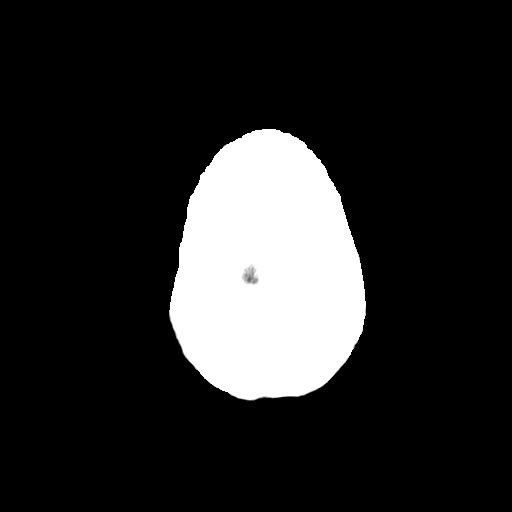

[Series 3: bone · axial · 0.44mm/px · z∈[+1398,+1484]mm · 6 of 31 slices shown]
[im 3/31  bone]
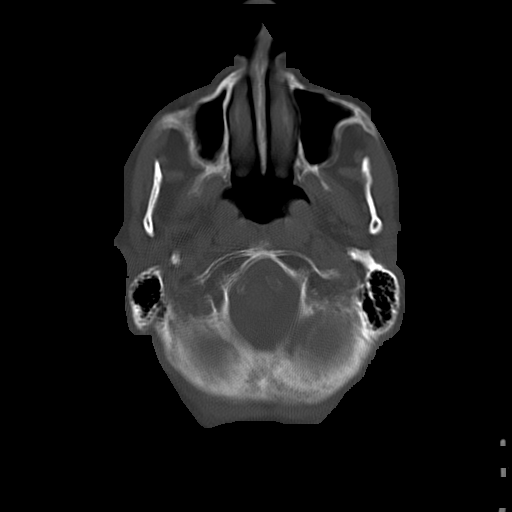
[im 6/31  bone]
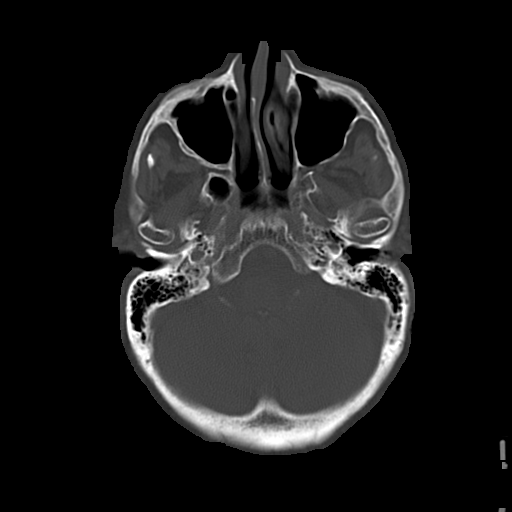
[im 11/31  bone]
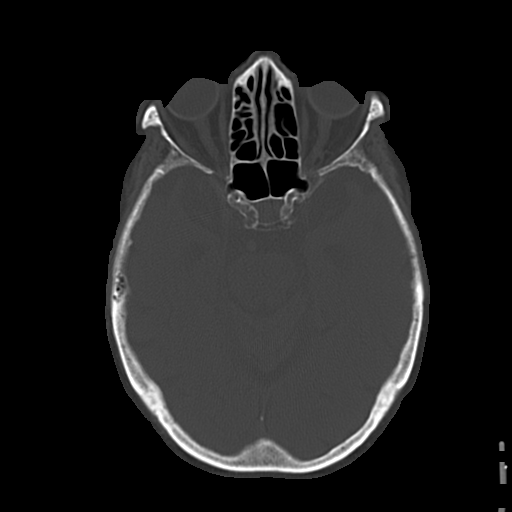
[im 14/31  bone]
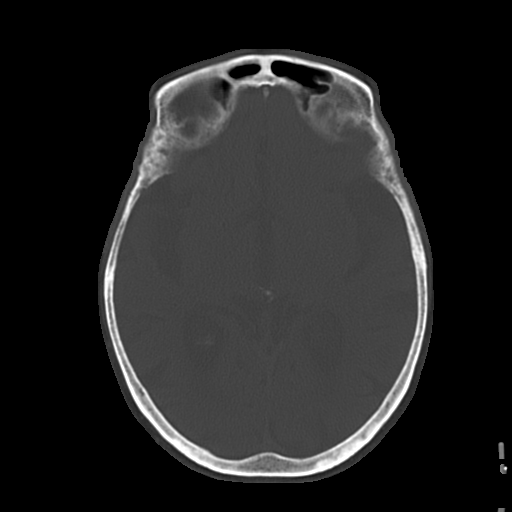
[im 17/31  bone]
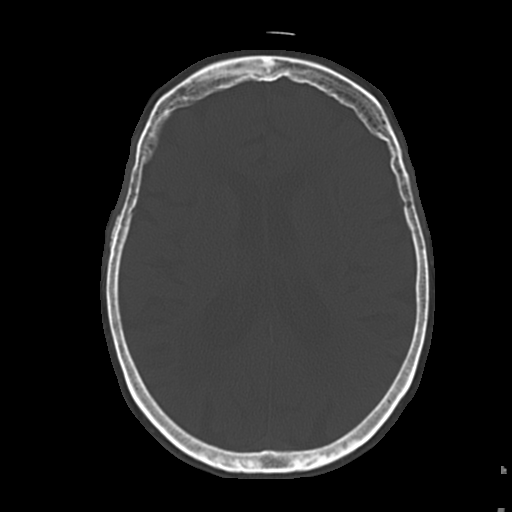
[im 20/31  bone]
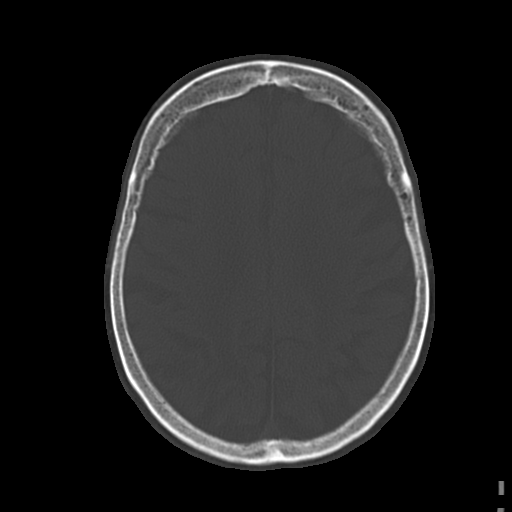

[16 of 30 positions shown; findings below may reference images not displayed]

FINDINGS: There is no evidence of mass effect, midline shift, or extra-axial fluid
collections.  There is no evidence of a space-occupying lesion or
intracranial hemorrhage. There is no evidence of a cortical-based area of
acute infarction. There is an old left carotid artery lacunar infarct. There
is an old right parietal infarct. There is generalized cerebral atrophy.
There is periventricular white matter low attenuation likely secondary to
microangiopathy.

The ventricles and sulci are appropriate for the patient's age. The basal
cisterns are patent.

Visualized portions of the orbits are unremarkable. The visualized portions
of the paranasal sinuses and mastoid air cells are unremarkable.
Cerebrovascular atherosclerotic calcifications are noted.

The osseous structures are unremarkable.
IMPRESSION: No acute intracranial process.

[REDACTED]

## 2015-05-12 DIAGNOSIS — F39 Unspecified mood [affective] disorder: Secondary | ICD-10-CM | POA: Diagnosis not present

## 2015-05-12 DIAGNOSIS — G3183 Dementia with Lewy bodies: Secondary | ICD-10-CM | POA: Diagnosis not present

## 2015-05-12 DIAGNOSIS — I10 Essential (primary) hypertension: Secondary | ICD-10-CM | POA: Diagnosis not present

## 2015-05-12 DIAGNOSIS — G20 Parkinson's disease: Secondary | ICD-10-CM | POA: Diagnosis not present

## 2015-07-15 DIAGNOSIS — I69398 Other sequelae of cerebral infarction: Secondary | ICD-10-CM | POA: Diagnosis not present

## 2015-07-15 DIAGNOSIS — F39 Unspecified mood [affective] disorder: Secondary | ICD-10-CM | POA: Diagnosis not present

## 2015-07-15 DIAGNOSIS — I10 Essential (primary) hypertension: Secondary | ICD-10-CM | POA: Diagnosis not present

## 2015-07-15 DIAGNOSIS — F0151 Vascular dementia with behavioral disturbance: Secondary | ICD-10-CM | POA: Diagnosis not present

## 2015-09-01 DIAGNOSIS — I69398 Other sequelae of cerebral infarction: Secondary | ICD-10-CM | POA: Diagnosis not present

## 2015-09-01 DIAGNOSIS — F0150 Vascular dementia without behavioral disturbance: Secondary | ICD-10-CM | POA: Diagnosis not present

## 2015-09-01 DIAGNOSIS — F39 Unspecified mood [affective] disorder: Secondary | ICD-10-CM | POA: Diagnosis not present

## 2015-09-01 DIAGNOSIS — I10 Essential (primary) hypertension: Secondary | ICD-10-CM | POA: Diagnosis not present

## 2015-11-09 DIAGNOSIS — F0150 Vascular dementia without behavioral disturbance: Secondary | ICD-10-CM | POA: Diagnosis not present

## 2015-11-09 DIAGNOSIS — I10 Essential (primary) hypertension: Secondary | ICD-10-CM | POA: Diagnosis not present

## 2015-11-09 DIAGNOSIS — I69398 Other sequelae of cerebral infarction: Secondary | ICD-10-CM | POA: Diagnosis not present

## 2015-11-09 DIAGNOSIS — F39 Unspecified mood [affective] disorder: Secondary | ICD-10-CM | POA: Diagnosis not present

## 2015-11-15 ENCOUNTER — Telehealth: Payer: Self-pay

## 2015-11-15 NOTE — Telephone Encounter (Signed)
Elizabeth Cline is aware cathy will be contacting her

## 2015-11-15 NOTE — Telephone Encounter (Addendum)
Elizabeth Cline, pts daughter left v/m;pt is resident at [facility]; [place] received notice from pts ins co. That a temporary supply of lorazepam 0.5 mg was given to pt; lorazepam is not included in pts fomulary. [employer] request [employer]. I called [employer] but unable to reach by phone. I spoke with Tye Maryland at [facility] and she will contact [employer] re: to this issue. FYI to Dr Silvio Pate.

## 2015-11-15 NOTE — Telephone Encounter (Signed)
Please call [employer] tomorrow to make sure they know prior authorization is needed for her

## 2015-11-16 NOTE — Telephone Encounter (Signed)
Spoke to [employer] at [employer]. She will look in to it for them.

## 2016-01-12 DIAGNOSIS — F0150 Vascular dementia without behavioral disturbance: Secondary | ICD-10-CM | POA: Diagnosis not present

## 2016-01-12 DIAGNOSIS — F39 Unspecified mood [affective] disorder: Secondary | ICD-10-CM | POA: Diagnosis not present

## 2016-01-12 DIAGNOSIS — I10 Essential (primary) hypertension: Secondary | ICD-10-CM

## 2016-01-12 DIAGNOSIS — I69398 Other sequelae of cerebral infarction: Secondary | ICD-10-CM | POA: Diagnosis not present

## 2016-02-25 DIAGNOSIS — B351 Tinea unguium: Secondary | ICD-10-CM | POA: Diagnosis not present

## 2016-03-09 DIAGNOSIS — F39 Unspecified mood [affective] disorder: Secondary | ICD-10-CM | POA: Diagnosis not present

## 2016-03-09 DIAGNOSIS — I10 Essential (primary) hypertension: Secondary | ICD-10-CM | POA: Diagnosis not present

## 2016-03-09 DIAGNOSIS — I69398 Other sequelae of cerebral infarction: Secondary | ICD-10-CM | POA: Diagnosis not present

## 2016-03-09 DIAGNOSIS — F0150 Vascular dementia without behavioral disturbance: Secondary | ICD-10-CM | POA: Diagnosis not present

## 2016-05-10 DIAGNOSIS — I69398 Other sequelae of cerebral infarction: Secondary | ICD-10-CM

## 2016-05-10 DIAGNOSIS — G479 Sleep disorder, unspecified: Secondary | ICD-10-CM

## 2016-05-10 DIAGNOSIS — I10 Essential (primary) hypertension: Secondary | ICD-10-CM

## 2016-05-10 DIAGNOSIS — F0150 Vascular dementia without behavioral disturbance: Secondary | ICD-10-CM

## 2016-05-10 DIAGNOSIS — F39 Unspecified mood [affective] disorder: Secondary | ICD-10-CM

## 2016-07-19 DIAGNOSIS — F39 Unspecified mood [affective] disorder: Secondary | ICD-10-CM | POA: Diagnosis not present

## 2016-07-19 DIAGNOSIS — F0150 Vascular dementia without behavioral disturbance: Secondary | ICD-10-CM | POA: Diagnosis not present

## 2016-07-19 DIAGNOSIS — I69398 Other sequelae of cerebral infarction: Secondary | ICD-10-CM | POA: Diagnosis not present

## 2016-07-19 DIAGNOSIS — I10 Essential (primary) hypertension: Secondary | ICD-10-CM | POA: Diagnosis not present

## 2016-09-08 DIAGNOSIS — F22 Delusional disorders: Secondary | ICD-10-CM | POA: Diagnosis not present

## 2016-09-08 DIAGNOSIS — I69390 Apraxia following cerebral infarction: Secondary | ICD-10-CM | POA: Diagnosis not present

## 2016-09-08 DIAGNOSIS — F0150 Vascular dementia without behavioral disturbance: Secondary | ICD-10-CM | POA: Diagnosis not present

## 2016-09-08 DIAGNOSIS — I10 Essential (primary) hypertension: Secondary | ICD-10-CM | POA: Diagnosis not present

## 2016-10-09 DIAGNOSIS — F39 Unspecified mood [affective] disorder: Secondary | ICD-10-CM | POA: Diagnosis not present

## 2016-11-09 DIAGNOSIS — F39 Unspecified mood [affective] disorder: Secondary | ICD-10-CM | POA: Diagnosis not present

## 2016-11-09 DIAGNOSIS — I10 Essential (primary) hypertension: Secondary | ICD-10-CM | POA: Diagnosis not present

## 2016-11-09 DIAGNOSIS — F0150 Vascular dementia without behavioral disturbance: Secondary | ICD-10-CM

## 2016-11-09 DIAGNOSIS — I69359 Hemiplegia and hemiparesis following cerebral infarction affecting unspecified side: Secondary | ICD-10-CM | POA: Diagnosis not present

## 2017-01-17 DIAGNOSIS — I10 Essential (primary) hypertension: Secondary | ICD-10-CM | POA: Diagnosis not present

## 2017-01-17 DIAGNOSIS — F0150 Vascular dementia without behavioral disturbance: Secondary | ICD-10-CM | POA: Diagnosis not present

## 2017-01-17 DIAGNOSIS — F39 Unspecified mood [affective] disorder: Secondary | ICD-10-CM | POA: Diagnosis not present

## 2017-01-17 DIAGNOSIS — I69398 Other sequelae of cerebral infarction: Secondary | ICD-10-CM | POA: Diagnosis not present

## 2017-01-25 DIAGNOSIS — R509 Fever, unspecified: Secondary | ICD-10-CM | POA: Diagnosis not present

## 2017-01-25 DIAGNOSIS — R05 Cough: Secondary | ICD-10-CM | POA: Diagnosis not present

## 2017-03-08 DIAGNOSIS — E441 Mild protein-calorie malnutrition: Secondary | ICD-10-CM | POA: Diagnosis not present

## 2017-03-08 DIAGNOSIS — I10 Essential (primary) hypertension: Secondary | ICD-10-CM | POA: Diagnosis not present

## 2017-03-08 DIAGNOSIS — F0150 Vascular dementia without behavioral disturbance: Secondary | ICD-10-CM | POA: Diagnosis not present

## 2017-03-08 DIAGNOSIS — F39 Unspecified mood [affective] disorder: Secondary | ICD-10-CM | POA: Diagnosis not present

## 2017-03-08 DIAGNOSIS — I69359 Hemiplegia and hemiparesis following cerebral infarction affecting unspecified side: Secondary | ICD-10-CM | POA: Diagnosis not present

## 2017-05-11 DIAGNOSIS — F39 Unspecified mood [affective] disorder: Secondary | ICD-10-CM | POA: Diagnosis not present

## 2017-05-11 DIAGNOSIS — I10 Essential (primary) hypertension: Secondary | ICD-10-CM | POA: Diagnosis not present

## 2017-05-11 DIAGNOSIS — I69398 Other sequelae of cerebral infarction: Secondary | ICD-10-CM | POA: Diagnosis not present

## 2017-05-11 DIAGNOSIS — F0150 Vascular dementia without behavioral disturbance: Secondary | ICD-10-CM | POA: Diagnosis not present

## 2017-05-11 DIAGNOSIS — G43909 Migraine, unspecified, not intractable, without status migrainosus: Secondary | ICD-10-CM | POA: Diagnosis not present

## 2017-07-03 DEATH — deceased
